# Patient Record
Sex: Male | Born: 1965 | Race: White | Hispanic: No | Marital: Married | Smoking: Former smoker
Health system: Southern US, Community
[De-identification: ages and names within clinical notes are randomized; demographics above are authoritative.]

## PROBLEM LIST (undated history)

## (undated) DIAGNOSIS — T4145XA Adverse effect of unspecified anesthetic, initial encounter: Secondary | ICD-10-CM

## (undated) DIAGNOSIS — D5 Iron deficiency anemia: Secondary | ICD-10-CM

## (undated) DIAGNOSIS — T8859XA Other complications of anesthesia, initial encounter: Secondary | ICD-10-CM

## (undated) DIAGNOSIS — M109 Gout, unspecified: Secondary | ICD-10-CM

## (undated) DIAGNOSIS — I82409 Acute embolism and thrombosis of unspecified deep veins of unspecified lower extremity: Secondary | ICD-10-CM

## (undated) DIAGNOSIS — E785 Hyperlipidemia, unspecified: Secondary | ICD-10-CM

## (undated) DIAGNOSIS — G43909 Migraine, unspecified, not intractable, without status migrainosus: Secondary | ICD-10-CM

## (undated) DIAGNOSIS — Z9989 Dependence on other enabling machines and devices: Secondary | ICD-10-CM

## (undated) DIAGNOSIS — R600 Localized edema: Secondary | ICD-10-CM

## (undated) DIAGNOSIS — N185 Chronic kidney disease, stage 5: Secondary | ICD-10-CM

## (undated) DIAGNOSIS — I2510 Atherosclerotic heart disease of native coronary artery without angina pectoris: Secondary | ICD-10-CM

## (undated) DIAGNOSIS — I219 Acute myocardial infarction, unspecified: Secondary | ICD-10-CM

## (undated) DIAGNOSIS — I4892 Unspecified atrial flutter: Secondary | ICD-10-CM

## (undated) DIAGNOSIS — E669 Obesity, unspecified: Secondary | ICD-10-CM

## (undated) DIAGNOSIS — D631 Anemia in chronic kidney disease: Secondary | ICD-10-CM

## (undated) DIAGNOSIS — R0602 Shortness of breath: Secondary | ICD-10-CM

## (undated) DIAGNOSIS — I10 Essential (primary) hypertension: Secondary | ICD-10-CM

## (undated) DIAGNOSIS — R0600 Dyspnea, unspecified: Secondary | ICD-10-CM

## (undated) DIAGNOSIS — T82898A Other specified complication of vascular prosthetic devices, implants and grafts, initial encounter: Secondary | ICD-10-CM

## (undated) DIAGNOSIS — F329 Major depressive disorder, single episode, unspecified: Secondary | ICD-10-CM

## (undated) DIAGNOSIS — R0609 Other forms of dyspnea: Secondary | ICD-10-CM

## (undated) DIAGNOSIS — F419 Anxiety disorder, unspecified: Secondary | ICD-10-CM

## (undated) DIAGNOSIS — G4733 Obstructive sleep apnea (adult) (pediatric): Secondary | ICD-10-CM

## (undated) DIAGNOSIS — I480 Paroxysmal atrial fibrillation: Secondary | ICD-10-CM

## (undated) DIAGNOSIS — R079 Chest pain, unspecified: Secondary | ICD-10-CM

## (undated) DIAGNOSIS — N184 Chronic kidney disease, stage 4 (severe): Secondary | ICD-10-CM

## (undated) DIAGNOSIS — I5042 Chronic combined systolic (congestive) and diastolic (congestive) heart failure: Secondary | ICD-10-CM

## (undated) DIAGNOSIS — I471 Supraventricular tachycardia: Secondary | ICD-10-CM

## (undated) DIAGNOSIS — E119 Type 2 diabetes mellitus without complications: Secondary | ICD-10-CM

## (undated) DIAGNOSIS — Z01818 Encounter for other preprocedural examination: Secondary | ICD-10-CM

## (undated) DIAGNOSIS — K219 Gastro-esophageal reflux disease without esophagitis: Secondary | ICD-10-CM

## (undated) DIAGNOSIS — K7469 Other cirrhosis of liver: Secondary | ICD-10-CM

## (undated) DIAGNOSIS — I5031 Acute diastolic (congestive) heart failure: Secondary | ICD-10-CM

## (undated) DIAGNOSIS — F32A Depression, unspecified: Secondary | ICD-10-CM

## (undated) DIAGNOSIS — G5603 Carpal tunnel syndrome, bilateral upper limbs: Secondary | ICD-10-CM

## (undated) DIAGNOSIS — I209 Angina pectoris, unspecified: Secondary | ICD-10-CM

## (undated) DIAGNOSIS — Z9289 Personal history of other medical treatment: Secondary | ICD-10-CM

## (undated) DIAGNOSIS — N186 End stage renal disease: Secondary | ICD-10-CM

## (undated) DIAGNOSIS — R011 Cardiac murmur, unspecified: Secondary | ICD-10-CM

## (undated) DIAGNOSIS — D519 Vitamin B12 deficiency anemia, unspecified: Secondary | ICD-10-CM

## (undated) DIAGNOSIS — K922 Gastrointestinal hemorrhage, unspecified: Secondary | ICD-10-CM

## (undated) DIAGNOSIS — I6529 Occlusion and stenosis of unspecified carotid artery: Secondary | ICD-10-CM

## (undated) DIAGNOSIS — I509 Heart failure, unspecified: Secondary | ICD-10-CM

## (undated) HISTORY — PX: ANTERIOR CERVICAL DECOMP/DISCECTOMY FUSION: SHX1161

## (undated) HISTORY — DX: Hyperlipidemia, unspecified: E78.5

## (undated) HISTORY — DX: Acute diastolic (congestive) heart failure: I50.31

## (undated) HISTORY — DX: Anxiety disorder, unspecified: F41.9

## (undated) HISTORY — DX: Essential (primary) hypertension: I10

## (undated) HISTORY — DX: Chronic combined systolic (congestive) and diastolic (congestive) heart failure: I50.42

## (undated) HISTORY — DX: Other specified complication of vascular prosthetic devices, implants and grafts, initial encounter: T82.898A

## (undated) HISTORY — DX: Supraventricular tachycardia: I47.1

## (undated) HISTORY — DX: Chest pain, unspecified: R07.9

## (undated) HISTORY — DX: Paroxysmal atrial fibrillation: I48.0

## (undated) HISTORY — DX: End stage renal disease: N18.6

## (undated) HISTORY — DX: Obesity, unspecified: E66.9

## (undated) HISTORY — DX: Major depressive disorder, single episode, unspecified: F32.9

## (undated) HISTORY — DX: Atherosclerotic heart disease of native coronary artery without angina pectoris: I25.10

## (undated) HISTORY — DX: Type 2 diabetes mellitus without complications: E11.9

## (undated) HISTORY — DX: Chronic kidney disease, stage 5: N18.5

## (undated) HISTORY — DX: Occlusion and stenosis of unspecified carotid artery: I65.29

## (undated) HISTORY — DX: Unspecified atrial flutter: I48.92

## (undated) HISTORY — PX: AV FISTULA PLACEMENT: SHX1204

## (undated) HISTORY — PX: TONSILLECTOMY: SUR1361

## (undated) HISTORY — DX: Other cirrhosis of liver: K74.69

## (undated) HISTORY — DX: Encounter for other preprocedural examination: Z01.818

## (undated) HISTORY — DX: Shortness of breath: R06.02

## (undated) HISTORY — PX: CERVICAL SPINE SURGERY: SHX589

## (undated) HISTORY — PX: FINGER FRACTURE SURGERY: SHX638

## (undated) HISTORY — DX: Anemia in chronic kidney disease: D63.1

## (undated) HISTORY — PX: CORONARY ANGIOPLASTY WITH STENT PLACEMENT: SHX49

## (undated) HISTORY — DX: Chronic kidney disease, stage 4 (severe): N18.4

## (undated) HISTORY — DX: Cardiac murmur, unspecified: R01.1

## (undated) HISTORY — DX: Localized edema: R60.0

## (undated) HISTORY — DX: Depression, unspecified: F32.A

## (undated) HISTORY — DX: Gastrointestinal hemorrhage, unspecified: K92.2

---

## 2009-07-13 DIAGNOSIS — G4733 Obstructive sleep apnea (adult) (pediatric): Secondary | ICD-10-CM

## 2009-07-13 DIAGNOSIS — I82409 Acute embolism and thrombosis of unspecified deep veins of unspecified lower extremity: Secondary | ICD-10-CM

## 2009-07-13 HISTORY — DX: Acute embolism and thrombosis of unspecified deep veins of unspecified lower extremity: I82.409

## 2009-07-13 HISTORY — DX: Obstructive sleep apnea (adult) (pediatric): G47.33

## 2009-09-02 ENCOUNTER — Inpatient Hospital Stay (HOSPITAL_COMMUNITY): Admission: EM | Admit: 2009-09-02 | Discharge: 2009-09-04 | Payer: Self-pay | Admitting: Internal Medicine

## 2009-09-02 ENCOUNTER — Ambulatory Visit: Payer: Self-pay | Admitting: Cardiology

## 2009-09-04 ENCOUNTER — Encounter (INDEPENDENT_AMBULATORY_CARE_PROVIDER_SITE_OTHER): Payer: Self-pay | Admitting: Internal Medicine

## 2010-03-04 ENCOUNTER — Ambulatory Visit: Payer: Self-pay | Admitting: Vascular Surgery

## 2010-03-05 ENCOUNTER — Ambulatory Visit (HOSPITAL_COMMUNITY): Admission: RE | Admit: 2010-03-05 | Discharge: 2010-03-05 | Payer: Self-pay | Admitting: Vascular Surgery

## 2010-03-05 ENCOUNTER — Ambulatory Visit: Payer: Self-pay | Admitting: Vascular Surgery

## 2010-03-05 HISTORY — PX: VEIN SURGERY: SHX48

## 2010-04-11 ENCOUNTER — Ambulatory Visit: Payer: Self-pay | Admitting: Vascular Surgery

## 2010-07-13 DIAGNOSIS — D5 Iron deficiency anemia: Secondary | ICD-10-CM

## 2010-07-13 HISTORY — PX: LACERATION REPAIR: SHX5168

## 2010-07-13 HISTORY — DX: Iron deficiency anemia: D50

## 2010-09-26 LAB — POCT I-STAT 4, (NA,K, GLUC, HGB,HCT)
Glucose, Bld: 122 mg/dL — ABNORMAL HIGH (ref 70–99)
Sodium: 140 mEq/L (ref 135–145)

## 2010-10-03 LAB — CBC
HCT: 38.1 % — ABNORMAL LOW (ref 39.0–52.0)
Hemoglobin: 12.7 g/dL — ABNORMAL LOW (ref 13.0–17.0)
MCHC: 32.8 g/dL (ref 30.0–36.0)
MCV: 87.6 fL (ref 78.0–100.0)
Platelets: 279 10*3/uL (ref 150–400)
RBC: 4.37 MIL/uL (ref 4.22–5.81)
RDW: 17.5 % — ABNORMAL HIGH (ref 11.5–15.5)
WBC: 8.2 10*3/uL (ref 4.0–10.5)

## 2010-10-03 LAB — GLUCOSE, CAPILLARY: Glucose-Capillary: 125 mg/dL — ABNORMAL HIGH (ref 70–99)

## 2010-10-03 LAB — COMPREHENSIVE METABOLIC PANEL
ALT: 18 U/L (ref 0–53)
Alkaline Phosphatase: 33 U/L — ABNORMAL LOW (ref 39–117)
CO2: 26 mEq/L (ref 19–32)
GFR calc Af Amer: 20 mL/min — ABNORMAL LOW (ref >60)
GFR calc non Af Amer: 16 mL/min — ABNORMAL LOW (ref >60)
Glucose, Bld: 96 mg/dL (ref 70–99)
Potassium: 3.7 mEq/L (ref 3.5–5.1)
Total Bilirubin: 0.9 mg/dL (ref 0.3–1.2)
Total Protein: 6.7 g/dL (ref 6.0–8.3)

## 2010-10-03 LAB — CK TOTAL AND CKMB (NOT AT ARMC)
CK, MB: 3.9 ng/mL (ref 0.3–4.0)
Relative Index: 2.6 — ABNORMAL HIGH (ref 0.0–2.5)
Relative Index: 2.7 — ABNORMAL HIGH (ref 0.0–2.5)
Relative Index: 3 — ABNORMAL HIGH (ref 0.0–2.5)

## 2010-10-03 LAB — BASIC METABOLIC PANEL
Calcium: 9.1 mg/dL (ref 8.4–10.5)
Creatinine, Ser: 3.89 mg/dL — ABNORMAL HIGH (ref 0.4–1.5)
GFR calc non Af Amer: 17 mL/min — ABNORMAL LOW (ref >60)
Glucose, Bld: 105 mg/dL — ABNORMAL HIGH (ref 70–99)
Potassium: 3.8 mEq/L (ref 3.5–5.1)
Sodium: 139 mEq/L (ref 135–145)

## 2010-10-03 LAB — DIFFERENTIAL
Basophils Absolute: 0.1 10*3/uL (ref 0.0–0.1)
Eosinophils Absolute: 0.4 10*3/uL (ref 0.0–0.7)
Lymphocytes Relative: 18 % (ref 12–46)

## 2010-10-03 LAB — TROPONIN I: Troponin I: 0.15 ng/mL — ABNORMAL HIGH (ref 0.00–0.06)

## 2010-11-25 NOTE — Assessment & Plan Note (Signed)
OFFICE VISIT   SCALES, Carlos Heath  DOB:  11-Oct-1965                                       04/11/2010  ZOXWR#:[id number]   This is a postoperative followup.  The patient is status post second  stage basilic vein transposition that was done on 03/05/2010.  The  patient notes since the operation some issues with some firmness at the  surgical site that resolved with time.  Hr was given some antibiotics by  his primary care physician but only took it for 5 days and the firmness  in the surgical site resolved with time.  He never had any fevers or  chills and denies any steal symptoms in the hand.  He does note some  ulnar anesthesia that he thinks may be different from his previous  anesthesia that he had in the hand.   His past medical history, past surgical history, family history, social  history, review of systems are as per previous visit on 03/04/2010.   On physical examination today he had vital signs of temperature 97.8, a  blood pressure 131/67, a heart rate of 50 and respirations of 12.  On  focused exam on the right arm he has an easily palpable thrill that is  palpable on top of his biceps and this thrill extends into the axilla.  No further hematoma is palpable at this location.  The incision site  seen appeared to be healing well.  He has 5/5 hand grip strength with  intact intrinsic and extrinsic muscle strength.  He has grossly intact  sensation to light touch and pinpoint in his fingers.   MEDICAL DECISION MAKING:  This is a 45 year old gentleman now status  post second stage basilic vein transposition.  He is now approximately  almost 1.3 months out from his procedure.  At this point there is  sufficient healing that his basilic vein transposition could now be  utilized for hemodialysis as necessary.  Per the patient, he does not  yet need hemodialysis.  This should give him some additional time to  fully heal his incision site and further  incorporate the basilic vein  transposition but he should be ready for cannulation when hemodialysis  is necessary.  He also is considering transplant.  I have discussed with  him the advantages and disadvantages of transplant and it appears he is  interested in such.  He will follow up with his nephrologist for full  evaluation of such.  I discussed with the patient that he will follow up  as needed for any additional dialysis access needs.  Thank you for  giving me the opportunity to participate in this patient's care.     Leonides Sake, MD  Electronically Signed   BC/MEDQ  D:  04/11/2010  Heath:  04/14/2010  Job:  [id number]   cc:   [name], M.D.

## 2010-11-25 NOTE — Consult Note (Signed)
NEW PATIENT Carlos Heath, Manas  DOB:  22-Feb-1966                                       03/04/2010  ZOXWR#:[id number]   This is a 45 year old patient with chronic renal insufficiency who had  an AV fistula created previously in [place], [place] in 2007.  Initially, he had a wrist fistula performed ([facility]) and eventually,  after vein mapping revealed the veins to be quite small, he had a  fistula created at his antecubital area in the right arm.  He was told  that this would later need to be revised before using it.  The only  dialysis he has ever had is following cardiac cath and intervention in  February 2011 when he had 2 dialysis sessions through a femoral  catheter.  He is now progressing his renal insufficiency and will need  hemodialysis in the near future with a creatinine in the 4s currently.   CHRONIC MEDICAL PROBLEMS:  1. Hypertension.  2. Chronic renal insufficiency.  3. Hyperlipidemia.  4. Coronary artery disease, previous PTCA and stenting on 3 occasions.  5. Sleep apnea.  6. Negative for diabetes, COPD, or stroke.   SOCIAL HISTORY:  The patient is single, has 2 children, is disabled.  He  has smoked up to a pack of cigarettes per day for 30 years; currently  smokes about 5 cigarettes a day.  Does not use alcohol.   FAMILY HISTORY:  Strongly positive for coronary artery disease in his  mother and father; stroke in his mother; hypertension in a brother.  Negative for diabetes.   REVIEW OF SYSTEMS:  Positive for anorexia, occasional blackouts in the  past.  Does have some occasional chest discomfort, dyspnea on exertion,  and has had gout in his feet in the past.  All other systems in the  review of systems are negative.   PHYSICAL EXAM:  Blood pressure 135/71, heart rate 61, respirations 20.  General:  He is a middle-aged male patient who is no parent distress,  alert and oriented x3.  HEENT:  Normal for age.  EOMs intact.   Lungs:  Clear.  No rhonchi or wheezing.  Cardiovascular:  Regular rhythm.  No  murmurs.  Carotid pulses 3+.  No audible bruit.  Abdomen:  Soft,  nontender with no masses palpable.  Musculoskeletal:  Free of major  deformities.  Neurologic:  Normal.  Skin:  Free of rashes.  Lower  extremity exam reveals 3+ femoral, popliteal, and dorsalis pedis pulses  bilaterally.  Upper extremity exam reveals the right arm to have an AV  fistula with a transverse incision in the antecubital area.  This  appears to be anastomosed to the basilic vein but has not been rerouted.  Cephalic vein is absent in the forearm and diminutive in the upper arm.  Both hands are well-perfused.   I examined the patient with the SonoSite today to confirm that this is,  indeed, a brachiobasilic fistula and the vein has enlarged significantly  up to the axillary area.   I discussed with the patient that this will need to be either re-routed  or superficialized to some degree before it can be utilized.  We will  schedule this to be done tomorrow by Dr. [name] at [facility] under  general anesthesia.  Hopefully, this will provide a satisfactory site  for vascular  access in this nice man who will need dialysis.     Carlos Heath, M.D.  Electronically Signed   JDL/MEDQ  D:  03/04/2010  T:  03/05/2010  Job:  [id number]   cc:   [name], M.D.

## 2011-01-29 ENCOUNTER — Ambulatory Visit (INDEPENDENT_AMBULATORY_CARE_PROVIDER_SITE_OTHER): Payer: Medicare Other

## 2011-01-29 DIAGNOSIS — N186 End stage renal disease: Secondary | ICD-10-CM

## 2011-01-29 NOTE — Assessment & Plan Note (Addendum)
OFFICE VISIT  DEVEON, KISIEL T DOB:  Apr 25, 1966                                       01/29/2011 ZOXWR#:[id number]  The patient is a 45 year old male who presents today for evaluation of right basilic vein transposition per Dr. Marland Mcalpine request.  The patient had second stage right basilic vein transposition performed by Dr. [name] on March 05, 2010.  The patient was seen in follow-up, and his fistula was maturing nicely.  He was instructed to follow up p.r.n.  The patient has not been started on hemodialysis.  The patient states that at his evaluation with Dr. Hyman Hopes a couple days ago, Dr. Hyman Hopes expressed concern that the fistula was not developing as he had expected.  Therefore, the patient was instructed to follow up here for evaluation of his fistula.  Past medical history, surgical history, medications, allergies, social history, family history, review of systems are unchanged from previous office visit.  PHYSICAL EXAMINATION:  Blood pressure 150/59 left arm sitting, O2 sat 97%, heart rate 64.  In general, this is a well-nourished male in no acute distress.  HEENT:  PERRL, EOMI.  Lungs:  Clear to auscultation. Cardiovascular:  Regular rate and rhythm.  Abdomen is soft, nontender with active bowel sounds.  Musculoskeletal:  No major deformities or cyanosis.  Extremities:  There is a 1+ right radial and 1+ right ulnar pulse palpated.  There is an excellent thrill in a well-developed right basilic vein transposition.  The fistula is mature, and this thrill was palpable up into the axilla.  There is 1 competing branch noted, but it does not appear to affect the strength of the fistula.  The previous incision is well-healed.  Motor and sensation are intact in the right upper extremity.  Neuro:  No focal weakness or paresthesias.  Skin:  No ulcers, no rashes.  ASSESSMENT:  Chronic renal insufficiency with a right basilic vein transposition, not on  hemodialysis.  PLAN:  The patient has a well-developed, mature, right basilic vein transposition.  For completeness, we will have the patient follow up within the next 3 months for an ultrasound of the fistula to measure depth and diameter.  This patient was discussed and also evaluated by Dr. Darrick Penna, who agrees with the assessment and plan.  Pecola Leisure, PA  Charles E. Fields, MD Electronically Signed  AY/MEDQ  D:  01/29/2011  T:  01/29/2011  Job:  [id number]

## 2011-03-18 ENCOUNTER — Encounter: Payer: Self-pay | Admitting: Vascular Surgery

## 2011-04-30 ENCOUNTER — Encounter: Payer: Self-pay | Admitting: Vascular Surgery

## 2011-05-01 ENCOUNTER — Ambulatory Visit: Payer: Medicare Other | Admitting: Vascular Surgery

## 2011-05-15 ENCOUNTER — Encounter (HOSPITAL_COMMUNITY): Payer: Medicare Other

## 2011-07-16 DIAGNOSIS — N039 Chronic nephritic syndrome with unspecified morphologic changes: Secondary | ICD-10-CM | POA: Diagnosis not present

## 2011-07-16 DIAGNOSIS — D631 Anemia in chronic kidney disease: Secondary | ICD-10-CM | POA: Diagnosis not present

## 2011-07-16 DIAGNOSIS — Z79899 Other long term (current) drug therapy: Secondary | ICD-10-CM | POA: Diagnosis not present

## 2011-07-16 DIAGNOSIS — N184 Chronic kidney disease, stage 4 (severe): Secondary | ICD-10-CM | POA: Diagnosis not present

## 2011-07-23 DIAGNOSIS — Z79899 Other long term (current) drug therapy: Secondary | ICD-10-CM | POA: Diagnosis not present

## 2011-07-23 DIAGNOSIS — D631 Anemia in chronic kidney disease: Secondary | ICD-10-CM | POA: Diagnosis not present

## 2011-07-23 DIAGNOSIS — N184 Chronic kidney disease, stage 4 (severe): Secondary | ICD-10-CM | POA: Diagnosis not present

## 2011-07-30 DIAGNOSIS — N184 Chronic kidney disease, stage 4 (severe): Secondary | ICD-10-CM | POA: Diagnosis not present

## 2011-07-30 DIAGNOSIS — D631 Anemia in chronic kidney disease: Secondary | ICD-10-CM | POA: Diagnosis not present

## 2011-07-30 DIAGNOSIS — Z79899 Other long term (current) drug therapy: Secondary | ICD-10-CM | POA: Diagnosis not present

## 2011-07-30 DIAGNOSIS — N039 Chronic nephritic syndrome with unspecified morphologic changes: Secondary | ICD-10-CM | POA: Diagnosis not present

## 2011-08-06 DIAGNOSIS — D631 Anemia in chronic kidney disease: Secondary | ICD-10-CM | POA: Diagnosis not present

## 2011-08-06 DIAGNOSIS — D509 Iron deficiency anemia, unspecified: Secondary | ICD-10-CM | POA: Diagnosis not present

## 2011-08-06 DIAGNOSIS — N039 Chronic nephritic syndrome with unspecified morphologic changes: Secondary | ICD-10-CM | POA: Diagnosis not present

## 2011-08-06 DIAGNOSIS — N184 Chronic kidney disease, stage 4 (severe): Secondary | ICD-10-CM | POA: Diagnosis not present

## 2011-08-06 DIAGNOSIS — Z79899 Other long term (current) drug therapy: Secondary | ICD-10-CM | POA: Diagnosis not present

## 2011-08-10 DIAGNOSIS — I10 Essential (primary) hypertension: Secondary | ICD-10-CM | POA: Diagnosis not present

## 2011-08-10 DIAGNOSIS — Z79899 Other long term (current) drug therapy: Secondary | ICD-10-CM | POA: Diagnosis not present

## 2011-08-10 DIAGNOSIS — N189 Chronic kidney disease, unspecified: Secondary | ICD-10-CM | POA: Diagnosis not present

## 2011-08-10 DIAGNOSIS — R0989 Other specified symptoms and signs involving the circulatory and respiratory systems: Secondary | ICD-10-CM | POA: Diagnosis not present

## 2011-08-10 DIAGNOSIS — I2510 Atherosclerotic heart disease of native coronary artery without angina pectoris: Secondary | ICD-10-CM | POA: Diagnosis not present

## 2011-08-10 DIAGNOSIS — E785 Hyperlipidemia, unspecified: Secondary | ICD-10-CM | POA: Diagnosis not present

## 2011-08-13 DIAGNOSIS — N184 Chronic kidney disease, stage 4 (severe): Secondary | ICD-10-CM | POA: Diagnosis not present

## 2011-08-13 DIAGNOSIS — Z79899 Other long term (current) drug therapy: Secondary | ICD-10-CM | POA: Diagnosis not present

## 2011-08-13 DIAGNOSIS — D631 Anemia in chronic kidney disease: Secondary | ICD-10-CM | POA: Diagnosis not present

## 2011-08-21 DIAGNOSIS — T148XXA Other injury of unspecified body region, initial encounter: Secondary | ICD-10-CM | POA: Diagnosis not present

## 2011-08-21 DIAGNOSIS — S139XXA Sprain of joints and ligaments of unspecified parts of neck, initial encounter: Secondary | ICD-10-CM | POA: Diagnosis not present

## 2011-08-21 DIAGNOSIS — S298XXA Other specified injuries of thorax, initial encounter: Secondary | ICD-10-CM | POA: Diagnosis not present

## 2011-08-21 DIAGNOSIS — S335XXA Sprain of ligaments of lumbar spine, initial encounter: Secondary | ICD-10-CM | POA: Diagnosis not present

## 2011-08-27 DIAGNOSIS — N184 Chronic kidney disease, stage 4 (severe): Secondary | ICD-10-CM | POA: Diagnosis not present

## 2011-08-27 DIAGNOSIS — N039 Chronic nephritic syndrome with unspecified morphologic changes: Secondary | ICD-10-CM | POA: Diagnosis not present

## 2011-08-27 DIAGNOSIS — D631 Anemia in chronic kidney disease: Secondary | ICD-10-CM | POA: Diagnosis not present

## 2011-08-31 DIAGNOSIS — I129 Hypertensive chronic kidney disease with stage 1 through stage 4 chronic kidney disease, or unspecified chronic kidney disease: Secondary | ICD-10-CM | POA: Diagnosis not present

## 2011-08-31 DIAGNOSIS — Z79899 Other long term (current) drug therapy: Secondary | ICD-10-CM | POA: Diagnosis not present

## 2011-08-31 DIAGNOSIS — R5381 Other malaise: Secondary | ICD-10-CM | POA: Diagnosis not present

## 2011-08-31 DIAGNOSIS — R0789 Other chest pain: Secondary | ICD-10-CM | POA: Diagnosis not present

## 2011-08-31 DIAGNOSIS — R0602 Shortness of breath: Secondary | ICD-10-CM | POA: Diagnosis not present

## 2011-08-31 DIAGNOSIS — R5383 Other fatigue: Secondary | ICD-10-CM | POA: Diagnosis not present

## 2011-08-31 DIAGNOSIS — R51 Headache: Secondary | ICD-10-CM | POA: Diagnosis not present

## 2011-08-31 DIAGNOSIS — Z87891 Personal history of nicotine dependence: Secondary | ICD-10-CM | POA: Diagnosis not present

## 2011-08-31 DIAGNOSIS — E7800 Pure hypercholesterolemia, unspecified: Secondary | ICD-10-CM | POA: Diagnosis not present

## 2011-08-31 DIAGNOSIS — I252 Old myocardial infarction: Secondary | ICD-10-CM | POA: Diagnosis not present

## 2011-08-31 DIAGNOSIS — N189 Chronic kidney disease, unspecified: Secondary | ICD-10-CM | POA: Diagnosis not present

## 2011-08-31 DIAGNOSIS — R110 Nausea: Secondary | ICD-10-CM | POA: Diagnosis not present

## 2011-09-01 DIAGNOSIS — N184 Chronic kidney disease, stage 4 (severe): Secondary | ICD-10-CM | POA: Diagnosis not present

## 2011-09-01 DIAGNOSIS — N183 Chronic kidney disease, stage 3 unspecified: Secondary | ICD-10-CM | POA: Diagnosis not present

## 2011-09-01 DIAGNOSIS — N2581 Secondary hyperparathyroidism of renal origin: Secondary | ICD-10-CM | POA: Diagnosis not present

## 2011-09-01 DIAGNOSIS — D649 Anemia, unspecified: Secondary | ICD-10-CM | POA: Diagnosis not present

## 2011-09-01 DIAGNOSIS — I129 Hypertensive chronic kidney disease with stage 1 through stage 4 chronic kidney disease, or unspecified chronic kidney disease: Secondary | ICD-10-CM | POA: Diagnosis not present

## 2011-09-03 DIAGNOSIS — N184 Chronic kidney disease, stage 4 (severe): Secondary | ICD-10-CM | POA: Diagnosis not present

## 2011-09-03 DIAGNOSIS — D631 Anemia in chronic kidney disease: Secondary | ICD-10-CM | POA: Diagnosis not present

## 2011-09-07 DIAGNOSIS — G4733 Obstructive sleep apnea (adult) (pediatric): Secondary | ICD-10-CM | POA: Diagnosis not present

## 2011-09-07 DIAGNOSIS — I252 Old myocardial infarction: Secondary | ICD-10-CM | POA: Diagnosis not present

## 2011-09-07 DIAGNOSIS — N189 Chronic kidney disease, unspecified: Secondary | ICD-10-CM | POA: Diagnosis not present

## 2011-09-07 DIAGNOSIS — I2789 Other specified pulmonary heart diseases: Secondary | ICD-10-CM | POA: Diagnosis not present

## 2011-09-10 DIAGNOSIS — N039 Chronic nephritic syndrome with unspecified morphologic changes: Secondary | ICD-10-CM | POA: Diagnosis not present

## 2011-09-10 DIAGNOSIS — D631 Anemia in chronic kidney disease: Secondary | ICD-10-CM | POA: Diagnosis not present

## 2011-09-10 DIAGNOSIS — N184 Chronic kidney disease, stage 4 (severe): Secondary | ICD-10-CM | POA: Diagnosis not present

## 2011-09-11 ENCOUNTER — Ambulatory Visit (INDEPENDENT_AMBULATORY_CARE_PROVIDER_SITE_OTHER): Payer: Medicare Other | Admitting: Vascular Surgery

## 2011-09-11 ENCOUNTER — Encounter: Payer: Self-pay | Admitting: Vascular Surgery

## 2011-09-11 VITALS — BP 126/54 | HR 56 | Resp 16 | Ht 71.0 in | Wt 242.0 lb

## 2011-09-11 DIAGNOSIS — N184 Chronic kidney disease, stage 4 (severe): Secondary | ICD-10-CM | POA: Insufficient documentation

## 2011-09-11 DIAGNOSIS — N186 End stage renal disease: Secondary | ICD-10-CM

## 2011-09-11 HISTORY — DX: Chronic kidney disease, stage 4 (severe): N18.4

## 2011-09-11 HISTORY — DX: End stage renal disease: N18.6

## 2011-09-11 NOTE — Progress Notes (Signed)
[facility] [place]  Established Dialysis Access  History of Present Illness  Carlos Heath is a 46 y.o. ([birth year]) male who presents for re-evaluation for permanent access.  Previous access procedures have been completed in the right arm: 03/05/10 R 2nd stage BVT.  The patient's complication from previous access procedures include: none.  The patient has never had a previous PPM placed.  Pt is sent for re-evaluation of the R arm access  Past Medical History, Past Surgical History, Social History, Family History, Medications, Allergies, and Review of Systems are unchanged from previous visit on 01/29/11  Physical Examination  Filed Vitals:   09/11/11 1447  BP: 126/54  Pulse: 56  Resp: 16  Height: 5\' 11"  (1.803 m)  Weight: 242 lb (109.77 kg)  SpO2: 92%   Body mass index is 33.75 kg/(m^2).  General: A&O x 3, WDWN  Pulmonary: Sym exp, good air movt, CTAB, no rales, rhonchi, & wheezing  Cardiac: RRR, Nl S1, S2, no Murmurs, rubs or gallops  Gastrointestinal: soft, NTND, -G/R, - HSM, - masses, - CVAT B  Musculoskeletal: M/S 5/5 throughout , Extremities without  ischemic changes , palpable thrill near arterial anastomosis which attenuates proximally , strong bruit, On Sonosite: BVT > 1 cm in diameter  Neurologic: CN 2-12 intact , Pain and light touch intact in extremities , Motor exam as listed above  Medical Decision Making  [name] is a 46 y.o. male who presents with CKD Stage IV-V  Based on exam and ultrasound evaluation, this R BVT is mature  My suspicion is that the lack of thrill proximally is due to the very lower venous resistance present due to the large vein diameter (>5 years of maturation of the 1st stage)  I will obtain formal access duplex on the R arm to document this in the next month  He will follow up in 1 month  Leonides Sake, MD [facility] [place] Office: [phone] Pager: [phone]  09/11/2011, 4:51  PM

## 2011-09-16 ENCOUNTER — Encounter (HOSPITAL_COMMUNITY): Payer: Medicare Other

## 2011-09-17 ENCOUNTER — Encounter (HOSPITAL_COMMUNITY): Payer: Medicare Other

## 2011-09-17 DIAGNOSIS — D509 Iron deficiency anemia, unspecified: Secondary | ICD-10-CM | POA: Diagnosis not present

## 2011-09-17 DIAGNOSIS — D631 Anemia in chronic kidney disease: Secondary | ICD-10-CM | POA: Diagnosis not present

## 2011-09-17 DIAGNOSIS — M25519 Pain in unspecified shoulder: Secondary | ICD-10-CM | POA: Diagnosis not present

## 2011-09-17 DIAGNOSIS — R0602 Shortness of breath: Secondary | ICD-10-CM | POA: Diagnosis not present

## 2011-09-17 DIAGNOSIS — N184 Chronic kidney disease, stage 4 (severe): Secondary | ICD-10-CM | POA: Diagnosis not present

## 2011-09-17 DIAGNOSIS — N039 Chronic nephritic syndrome with unspecified morphologic changes: Secondary | ICD-10-CM | POA: Diagnosis not present

## 2011-09-20 DIAGNOSIS — D631 Anemia in chronic kidney disease: Secondary | ICD-10-CM | POA: Diagnosis not present

## 2011-09-20 DIAGNOSIS — M25519 Pain in unspecified shoulder: Secondary | ICD-10-CM | POA: Diagnosis not present

## 2011-09-20 DIAGNOSIS — R0602 Shortness of breath: Secondary | ICD-10-CM | POA: Diagnosis not present

## 2011-09-20 DIAGNOSIS — N184 Chronic kidney disease, stage 4 (severe): Secondary | ICD-10-CM | POA: Diagnosis not present

## 2011-09-20 DIAGNOSIS — N039 Chronic nephritic syndrome with unspecified morphologic changes: Secondary | ICD-10-CM | POA: Diagnosis not present

## 2011-09-20 DIAGNOSIS — D509 Iron deficiency anemia, unspecified: Secondary | ICD-10-CM | POA: Diagnosis not present

## 2011-10-12 DIAGNOSIS — D631 Anemia in chronic kidney disease: Secondary | ICD-10-CM | POA: Diagnosis not present

## 2011-10-12 DIAGNOSIS — N184 Chronic kidney disease, stage 4 (severe): Secondary | ICD-10-CM | POA: Diagnosis not present

## 2011-10-16 DIAGNOSIS — E876 Hypokalemia: Secondary | ICD-10-CM | POA: Diagnosis not present

## 2011-10-19 DIAGNOSIS — N184 Chronic kidney disease, stage 4 (severe): Secondary | ICD-10-CM | POA: Diagnosis not present

## 2011-10-19 DIAGNOSIS — D649 Anemia, unspecified: Secondary | ICD-10-CM | POA: Diagnosis not present

## 2011-10-19 DIAGNOSIS — E876 Hypokalemia: Secondary | ICD-10-CM | POA: Diagnosis not present

## 2011-10-22 ENCOUNTER — Encounter: Payer: Self-pay | Admitting: Vascular Surgery

## 2011-10-22 DIAGNOSIS — M722 Plantar fascial fibromatosis: Secondary | ICD-10-CM | POA: Diagnosis not present

## 2011-10-22 DIAGNOSIS — M109 Gout, unspecified: Secondary | ICD-10-CM | POA: Diagnosis not present

## 2011-10-23 ENCOUNTER — Ambulatory Visit: Payer: Medicare Other | Admitting: Vascular Surgery

## 2011-11-05 DIAGNOSIS — I10 Essential (primary) hypertension: Secondary | ICD-10-CM | POA: Diagnosis not present

## 2011-11-05 DIAGNOSIS — E782 Mixed hyperlipidemia: Secondary | ICD-10-CM | POA: Diagnosis not present

## 2011-11-05 DIAGNOSIS — F331 Major depressive disorder, recurrent, moderate: Secondary | ICD-10-CM | POA: Diagnosis not present

## 2011-11-05 DIAGNOSIS — M109 Gout, unspecified: Secondary | ICD-10-CM | POA: Diagnosis not present

## 2011-11-05 DIAGNOSIS — N184 Chronic kidney disease, stage 4 (severe): Secondary | ICD-10-CM | POA: Diagnosis not present

## 2011-11-12 ENCOUNTER — Encounter: Payer: Self-pay | Admitting: Vascular Surgery

## 2011-11-13 ENCOUNTER — Ambulatory Visit (INDEPENDENT_AMBULATORY_CARE_PROVIDER_SITE_OTHER): Payer: Medicare Other | Admitting: Vascular Surgery

## 2011-11-13 ENCOUNTER — Encounter: Payer: Self-pay | Admitting: Vascular Surgery

## 2011-11-13 ENCOUNTER — Encounter (INDEPENDENT_AMBULATORY_CARE_PROVIDER_SITE_OTHER): Payer: Medicare Other | Admitting: *Deleted

## 2011-11-13 VITALS — BP 118/67 | HR 56 | Temp 98.3°F | Ht 68.0 in | Wt 226.0 lb

## 2011-11-13 DIAGNOSIS — N184 Chronic kidney disease, stage 4 (severe): Secondary | ICD-10-CM

## 2011-11-13 DIAGNOSIS — N186 End stage renal disease: Secondary | ICD-10-CM

## 2011-11-13 NOTE — Progress Notes (Addendum)
[facility] [place]  Established Dialysis Access  History of Present Illness  Carlos Heath is a 46 y.o. (07-13-1966) male who presents for follow up R arm access duplex.  He still is not on hemodialysis.  He denies any steal sx.  He does not some mild uremia sx but has not been told he need dialysis yet.  Past Medical History, Past Surgical History, Social History, Family History, Medications, Allergies, and Review of Systems are unchanged from previous visit on 09/11/11.  Physical Examination  Filed Vitals:   11/13/11 0956  BP: 118/67  Pulse: 56  Temp: 98.3 F (36.8 C)  TempSrc: Oral  Height: 5\' 8"  (1.727 m)  Weight: 226 lb (102.513 kg)   Body mass index is 34.36 kg/(m^2).  General: A&O x 3, WDWN, mildly obese  Pulmonary: Sym exp, good air movt, CTAB, no rales, rhonchi, & wheezing  Cardiac: RRR, Nl S1, S2, no Murmurs, rubs or gallops  Gastrointestinal: soft, NTND, -G/R, - HSM, - masses, - CVAT B  Musculoskeletal: M/S 5/5 throughout , Extremities without  ischemic changes , palpable thrill in distal arm up to mid arm  Neurologic: CN 2-12 intact , Pain and light touch intact in extremities , Motor exam as listed above  Non-Invasive Vascular Imaging  R arm access duplex (Date: 11/13/11):   BVT .97-1.6 cm in diameter  Depth: 0.33 - 1.07 cm  Medical Decision Making  Carlos Heath is a 46 y.o. male who presents with CKD IV   Based on his access duplex, he probably can be cannulated proximally and distally.  The mid segment may be too deep and need additional superificalization, which is unusual as I usually superficalize the basilic veinas part of the 2nd stage of the BVT.  I had an extensive discussion with this patient in regards to the nature of access surgery, including risk, benefits, and alternatives.    The patient is aware that the risks of access surgery include but are not limited to: bleeding, infection, steal syndrome, nerve damage,  ischemic monomelic neuropathy, failure of access to mature, and possible need for additional access procedures in the future.  The patient want to discuss it with Dr. Hyman Hopes prior to proceeding.   Leonides Sake, MD [facility] [place] Office: [phone] Pager: [phone]  11/13/2011, 1:31 PM

## 2011-11-17 NOTE — Procedures (Unsigned)
VASCULAR LAB EXAM  INDICATION:  Chronic kidney disease stage 4.  HISTORY: Diabetes: Cardiac: Hypertension:  EXAM:  Right brachiobasilic AV fistula duplex.  IMPRESSION: 1. Patent right brachiobasilic arteriovenous fistula with no internal     vessel narrowing noted.  Maximum velocity of 341 cm/s noted at the     distal brachial artery level with no internal narrowing noted. 2. The right radial artery flow appears antegrade with a velocity of     25 cm/s noted at rest and 67 cm/s noted with arteriovenous fistula     compression. 3. Depth, diameter, and velocity measurements are noted on the     attached worksheet.  ___________________________________________ Fransisco Hertz, MD  CH/MEDQ  D:  11/16/2011  T:  11/16/2011  Job:  [id number]

## 2011-11-18 DIAGNOSIS — D649 Anemia, unspecified: Secondary | ICD-10-CM | POA: Diagnosis not present

## 2011-11-18 DIAGNOSIS — N184 Chronic kidney disease, stage 4 (severe): Secondary | ICD-10-CM | POA: Diagnosis not present

## 2011-11-23 DIAGNOSIS — N184 Chronic kidney disease, stage 4 (severe): Secondary | ICD-10-CM | POA: Diagnosis not present

## 2011-11-23 DIAGNOSIS — D638 Anemia in other chronic diseases classified elsewhere: Secondary | ICD-10-CM | POA: Diagnosis not present

## 2011-12-02 ENCOUNTER — Other Ambulatory Visit: Payer: Self-pay | Admitting: *Deleted

## 2011-12-02 ENCOUNTER — Encounter (HOSPITAL_COMMUNITY): Payer: Self-pay | Admitting: Pharmacy Technician

## 2011-12-02 DIAGNOSIS — K219 Gastro-esophageal reflux disease without esophagitis: Secondary | ICD-10-CM | POA: Diagnosis not present

## 2011-12-02 DIAGNOSIS — M109 Gout, unspecified: Secondary | ICD-10-CM | POA: Diagnosis not present

## 2011-12-02 DIAGNOSIS — F331 Major depressive disorder, recurrent, moderate: Secondary | ICD-10-CM | POA: Diagnosis not present

## 2011-12-15 ENCOUNTER — Encounter (HOSPITAL_COMMUNITY): Payer: Self-pay | Admitting: *Deleted

## 2011-12-15 DIAGNOSIS — N184 Chronic kidney disease, stage 4 (severe): Secondary | ICD-10-CM | POA: Diagnosis not present

## 2011-12-15 DIAGNOSIS — D638 Anemia in other chronic diseases classified elsewhere: Secondary | ICD-10-CM | POA: Diagnosis not present

## 2011-12-15 MED ORDER — SODIUM CHLORIDE 0.9 % IV SOLN
INTRAVENOUS | Status: DC
  Administered 2011-12-16: 13:00:00 via INTRAVENOUS

## 2011-12-15 NOTE — Progress Notes (Addendum)
After phone interview, we had several places to contact for info.........(Delice Bison is aware). EKG & Cxr were performed at [facility] Sleep Study was performed at [facility] in [facility] Cardiac Cath was performed at [facility] Last office note from [facility] in [place].........  Plus, the patient did tell me that after his last surgery, he was unable to void and had to have a catheter Placed...Marland KitchenMarland KitchenMarland Kitchen  Plus, he has h/o MRSA.. Aug 2011     .DA

## 2011-12-16 ENCOUNTER — Encounter (HOSPITAL_COMMUNITY)
Admission: RE | Disposition: A | Discharge status: Home / Self Care | Payer: Self-pay | Source: Ambulatory Visit | Attending: Vascular Surgery

## 2011-12-16 ENCOUNTER — Ambulatory Visit (HOSPITAL_COMMUNITY)
Admission: RE | Admit: 2011-12-16 | Discharge: 2011-12-16 | Disposition: A | Discharge status: Home / Self Care | Payer: Medicare Other | Source: Ambulatory Visit | Attending: Vascular Surgery | Admitting: Vascular Surgery

## 2011-12-16 ENCOUNTER — Encounter (HOSPITAL_COMMUNITY): Payer: Self-pay | Admitting: Anesthesiology

## 2011-12-16 ENCOUNTER — Encounter (HOSPITAL_COMMUNITY): Payer: Self-pay | Admitting: *Deleted

## 2011-12-16 ENCOUNTER — Ambulatory Visit (HOSPITAL_COMMUNITY): Payer: Medicare Other | Admitting: Anesthesiology

## 2011-12-16 DIAGNOSIS — I6529 Occlusion and stenosis of unspecified carotid artery: Secondary | ICD-10-CM | POA: Insufficient documentation

## 2011-12-16 DIAGNOSIS — F329 Major depressive disorder, single episode, unspecified: Secondary | ICD-10-CM | POA: Diagnosis not present

## 2011-12-16 DIAGNOSIS — Y832 Surgical operation with anastomosis, bypass or graft as the cause of abnormal reaction of the patient, or of later complication, without mention of misadventure at the time of the procedure: Secondary | ICD-10-CM | POA: Insufficient documentation

## 2011-12-16 DIAGNOSIS — G4730 Sleep apnea, unspecified: Secondary | ICD-10-CM | POA: Insufficient documentation

## 2011-12-16 DIAGNOSIS — E785 Hyperlipidemia, unspecified: Secondary | ICD-10-CM | POA: Diagnosis not present

## 2011-12-16 DIAGNOSIS — F3289 Other specified depressive episodes: Secondary | ICD-10-CM | POA: Diagnosis not present

## 2011-12-16 DIAGNOSIS — I2510 Atherosclerotic heart disease of native coronary artery without angina pectoris: Secondary | ICD-10-CM | POA: Diagnosis not present

## 2011-12-16 DIAGNOSIS — T82898A Other specified complication of vascular prosthetic devices, implants and grafts, initial encounter: Secondary | ICD-10-CM

## 2011-12-16 DIAGNOSIS — M129 Arthropathy, unspecified: Secondary | ICD-10-CM | POA: Diagnosis not present

## 2011-12-16 DIAGNOSIS — N186 End stage renal disease: Secondary | ICD-10-CM

## 2011-12-16 DIAGNOSIS — N189 Chronic kidney disease, unspecified: Secondary | ICD-10-CM | POA: Insufficient documentation

## 2011-12-16 DIAGNOSIS — I129 Hypertensive chronic kidney disease with stage 1 through stage 4 chronic kidney disease, or unspecified chronic kidney disease: Secondary | ICD-10-CM | POA: Diagnosis not present

## 2011-12-16 DIAGNOSIS — F411 Generalized anxiety disorder: Secondary | ICD-10-CM | POA: Diagnosis not present

## 2011-12-16 HISTORY — DX: Other complications of anesthesia, initial encounter: T88.59XA

## 2011-12-16 HISTORY — DX: Adverse effect of unspecified anesthetic, initial encounter: T41.45XA

## 2011-12-16 LAB — POCT I-STAT 4, (NA,K, GLUC, HGB,HCT)
HCT: 37 % — ABNORMAL LOW (ref 39.0–52.0)
Hemoglobin: 12.6 g/dL — ABNORMAL LOW (ref 13.0–17.0)
Potassium: 3.4 mEq/L — ABNORMAL LOW (ref 3.5–5.1)
Sodium: 140 mEq/L (ref 135–145)

## 2011-12-16 SURGERY — REVISON OF ARTERIOVENOUS FISTULA
Anesthesia: General | Site: Arm Upper | Laterality: Right | Wound class: Clean

## 2011-12-16 MED ORDER — LIDOCAINE HCL (CARDIAC) 20 MG/ML IV SOLN
INTRAVENOUS | Status: DC | PRN
  Administered 2011-12-16: 50 mg via INTRAVENOUS

## 2011-12-16 MED ORDER — CEFAZOLIN SODIUM 1-5 GM-% IV SOLN
INTRAVENOUS | Status: DC | PRN
  Administered 2011-12-16: 1 g via INTRAVENOUS

## 2011-12-16 MED ORDER — SODIUM CHLORIDE 0.9 % IR SOLN
Status: DC | PRN
  Administered 2011-12-16: 14:00:00

## 2011-12-16 MED ORDER — MUPIROCIN 2 % EX OINT: TOPICAL_OINTMENT | Freq: Two times a day (BID) | CUTANEOUS | Status: AC

## 2011-12-16 MED ORDER — PROPOFOL 10 MG/ML IV EMUL
INTRAVENOUS | Status: DC | PRN
  Administered 2011-12-16 (×2): 100 mg via INTRAVENOUS

## 2011-12-16 MED ORDER — SODIUM CHLORIDE 0.9 % IR SOLN
Status: DC | PRN
  Administered 2011-12-16: 1000 mL

## 2011-12-16 MED ORDER — MUPIROCIN 2 % EX OINT
TOPICAL_OINTMENT | CUTANEOUS | Status: AC
  Filled 2011-12-16: qty 22

## 2011-12-16 MED ORDER — CEFAZOLIN SODIUM 1-5 GM-% IV SOLN
INTRAVENOUS | Status: AC
  Filled 2011-12-16: qty 50

## 2011-12-16 MED ORDER — MIDAZOLAM HCL 5 MG/5ML IJ SOLN
INTRAMUSCULAR | Status: DC | PRN
  Administered 2011-12-16: 2 mg via INTRAVENOUS

## 2011-12-16 MED ORDER — MUPIROCIN 2 % EX OINT
TOPICAL_OINTMENT | Freq: Two times a day (BID) | CUTANEOUS | Status: DC
  Administered 2011-12-16: 11:00:00 via NASAL
  Filled 2011-12-16: qty 22

## 2011-12-16 MED ORDER — FENTANYL CITRATE 0.05 MG/ML IJ SOLN
INTRAMUSCULAR | Status: DC | PRN
  Administered 2011-12-16: 100 ug via INTRAVENOUS

## 2011-12-16 SURGICAL SUPPLY — 31 items
CANISTER SUCTION 2500CC (MISCELLANEOUS) ×2 IMPLANT
CLIP TI MEDIUM 6 (CLIP) ×2 IMPLANT
CLIP TI WIDE RED SMALL 6 (CLIP) ×2 IMPLANT
CLOTH BEACON ORANGE TIMEOUT ST (SAFETY) ×2 IMPLANT
COVER PROBE W GEL 5X96 (DRAPES) IMPLANT
COVER SURGICAL LIGHT HANDLE (MISCELLANEOUS) ×4 IMPLANT
DECANTER SPIKE VIAL GLASS SM (MISCELLANEOUS) ×2 IMPLANT
DERMABOND ADVANCED (GAUZE/BANDAGES/DRESSINGS) ×1
DERMABOND ADVANCED .7 DNX12 (GAUZE/BANDAGES/DRESSINGS) ×1 IMPLANT
DRAIN PENROSE 1/2X12 LTX STRL (WOUND CARE) IMPLANT
ELECT REM PT RETURN 9FT ADLT (ELECTROSURGICAL) ×2
ELECTRODE REM PT RTRN 9FT ADLT (ELECTROSURGICAL) ×1 IMPLANT
GLOVE BIO SURGEON STRL SZ7 (GLOVE) ×2 IMPLANT
GLOVE BIOGEL PI IND STRL 7.5 (GLOVE) ×1 IMPLANT
GLOVE BIOGEL PI INDICATOR 7.5 (GLOVE) ×1
GOWN STRL NON-REIN LRG LVL3 (GOWN DISPOSABLE) ×4 IMPLANT
KIT BASIN OR (CUSTOM PROCEDURE TRAY) ×2 IMPLANT
KIT ROOM TURNOVER OR (KITS) ×2 IMPLANT
NS IRRIG 1000ML POUR BTL (IV SOLUTION) ×2 IMPLANT
PACK CV ACCESS (CUSTOM PROCEDURE TRAY) ×2 IMPLANT
PAD ARMBOARD 7.5X6 YLW CONV (MISCELLANEOUS) ×4 IMPLANT
SPONGE SURGIFOAM ABS GEL 100 (HEMOSTASIS) IMPLANT
SUT MNCRL AB 4-0 PS2 18 (SUTURE) ×2 IMPLANT
SUT PROLENE 6 0 BV (SUTURE) IMPLANT
SUT PROLENE 7 0 BV 1 (SUTURE) ×2 IMPLANT
SUT VIC AB 3-0 SH 27 (SUTURE) ×1
SUT VIC AB 3-0 SH 27X BRD (SUTURE) ×1 IMPLANT
TOWEL OR 17X24 6PK STRL BLUE (TOWEL DISPOSABLE) ×2 IMPLANT
TOWEL OR 17X26 10 PK STRL BLUE (TOWEL DISPOSABLE) ×2 IMPLANT
UNDERPAD 30X30 INCONTINENT (UNDERPADS AND DIAPERS) ×2 IMPLANT
WATER STERILE IRR 1000ML POUR (IV SOLUTION) ×2 IMPLANT

## 2011-12-16 NOTE — Anesthesia Postprocedure Evaluation (Signed)
  Anesthesia Post-op Note  Patient: Carlos Heath  Procedure(s) Performed: Procedure(s) (LRB): REVISON OF ARTERIOVENOUS FISTULA (Right)  Patient Location: PACU  Anesthesia Type: General  Level of Consciousness: awake and alert   Airway and Oxygen Therapy: Patient Spontanous Breathing and Patient connected to nasal cannula oxygen  Post-op Pain: mild  Post-op Assessment: Post-op Vital signs reviewed, Patient's Cardiovascular Status Stable, Respiratory Function Stable, Patent Airway and No signs of Nausea or vomiting  Post-op Vital Signs: Reviewed and stable  Complications: No apparent anesthesia complications

## 2011-12-16 NOTE — H&P (Signed)
[facility] [place]  Brief Access History and Physical  History of Present Illness  Carlos Heath is a 46 y.o. male who presents with chief complaint: deep R BVT.  The patient presents today for superificialization of R BVT.    Past Medical History  Diagnosis Date  . Chronic kidney disease   . CAD (coronary artery disease)   . Carotid artery occlusion   . Hypertension   . Hyperlipidemia   . Anemia   . Depression   . Arthritis   . Heart murmur   . Anxiety   . Sleep apnea     Tested at [facility].....cardiology ordered it    Past Surgical History  Procedure Date  . Av fistula placement   . Cervical spine surgery   . Stents     cardiac stents placed 2011    History   Social History  . Marital Status: Single    Spouse Name: N/A    Number of Children: N/A  . Years of Education: N/A   Occupational History  . Not on file.   Social History Main Topics  . Smoking status: Former Smoker -- 1.0 packs/day for 35 years    Types: Cigarettes    Quit date: 01/11/2011  . Smokeless tobacco: Not on file  . Alcohol Use: No  . Drug Use: No  . Sexually Active:    Other Topics Concern  . Not on file   Social History Narrative  . No narrative on file    Family History  Problem Relation Age of Onset  . Heart disease Mother     before age 71  . Stroke Mother   . Hyperlipidemia Mother   . Hypertension Mother   . Heart attack Mother   . Other Mother     bleeding problems  . Heart disease Father     before age 77  . Hyperlipidemia Father   . Hypertension Father   . Heart attack Father   . Hypertension Brother     No current facility-administered medications on file prior to encounter.   Current Outpatient Prescriptions on File Prior to Encounter  Medication Sig Dispense Refill  . aspirin EC 81 MG tablet Take 325 mg by mouth daily.       Marland Kitchen desvenlafaxine (PRISTIQ) 50 MG 24 hr tablet Take 100 mg by mouth daily.      Marland Kitchen dicyclomine (BENTYL) 10  MG capsule Take 10 mg by mouth 4 (four) times daily -  before meals and at bedtime.      . febuxostat (ULORIC) 40 MG tablet Take 80 mg by mouth daily.        . furosemide (LASIX) 80 MG tablet Take 160 mg by mouth 3 (three) times daily.       . hydrALAZINE (APRESOLINE) 100 MG tablet Take 100 mg by mouth 4 (four) times daily.        Marland Kitchen labetalol (NORMODYNE) 200 MG tablet Take 300 mg by mouth 2 (two) times daily.       . metolazone (ZAROXOLYN) 2.5 MG tablet Take 2.5 mg by mouth 3 (three) times a week. Mon, Wed, and Fri      . minoxidil (LONITEN) 10 MG tablet Take 10 mg by mouth 2 (two) times daily.        . nitroGLYCERIN (NITROSTAT) 0.4 MG SL tablet Place 0.4 mg under the tongue every 5 (five) minutes as needed.        Marland Kitchen omeprazole (PRILOSEC) 40  MG capsule Take 40 mg by mouth daily.      Marland Kitchen oxycodone (OXY-IR) 5 MG capsule Take 5 mg by mouth every 4 (four) hours as needed. For pain      . potassium chloride SA (K-DUR,KLOR-CON) 20 MEQ tablet Take 20 mEq by mouth 2 (two) times daily.      . pravastatin (PRAVACHOL) 80 MG tablet Take 80 mg by mouth daily.      . promethazine (PHENERGAN) 25 MG tablet Take 25 mg by mouth every 8 (eight) hours as needed. For nausea      . zolpidem (AMBIEN) 10 MG tablet Take 10 mg by mouth at bedtime as needed. For sleep        Allergies  Allergen Reactions  . Lisinopril Rash  . Hydrocodone Nausea And Vomiting  . Morphine And Related Nausea And Vomiting  . Norvasc (Amlodipine Besylate) Hives    Review of Systems: Kidney Disease, As listed above, otherwise negative.  Physical Examination  Filed Vitals:   12/15/11 0903 12/16/11 0858  BP:  132/77  Pulse:  62  Temp:  98.6 F (37 C)  TempSrc:  Oral  Resp:  20  Height: 5\' 8"  (1.727 m)   Weight: 223 lb (101.152 kg)   SpO2:  95%   Body mass index is 33.91 kg/(m^2).  General: A&O x 3, WDWN  Pulmonary: Sym exp, good air movt, CTAB, no rales, rhonchi, & wheezing  Cardiac: RRR, Nl S1, S2, no Murmurs, rubs or  gallops  Gastrointestinal: soft, NTND, -G/R, - HSM, - masses, - CVAT B  Musculoskeletal: M/S 5/5 throughout , Extremities without ischemic changes , palpable thrill and bruit in right upper arm  Laboratory See iStat  Medical Decision Making  Carlos Heath is a 46 y.o. male who presents with: deep R BVT.   The patient is scheduled for: superficialization of R BVT  Risk, benefits, and alternatives to access surgery were discussed.  The patient is aware the risks include but are not limited to: bleeding, infection, steal syndrome, nerve damage, ischemic monomelic neuropathy, failure to mature, and need for additional procedures.  The patient is aware of the risks and agrees to proceed.  Leonides Sake, MD [facility] [place] Office: [phone] Pager: [phone]  12/16/2011, 10:51 AM

## 2011-12-16 NOTE — Anesthesia Preprocedure Evaluation (Addendum)
Anesthesia Evaluation  Patient identified by MRN, date of birth, ID band Patient awake    Reviewed: Allergy & Precautions, H&P , NPO status , Patient's Chart, lab work & pertinent test results, reviewed documented beta blocker date and time   Airway Mallampati: II TM Distance: >3 FB Neck ROM: Full    Dental No notable dental hx. (+) Teeth Intact and Dental Advisory Given   Pulmonary sleep apnea and Continuous Positive Airway Pressure Ventilation ,  breath sounds clear to auscultation  Pulmonary exam normal       Cardiovascular hypertension, On Home Beta Blockers, Pt. on medications and Pt. on home beta blockers + angina with exertion + CAD negative cardio ROS  + Valvular Problems/Murmurs Rhythm:Regular Rate:Normal     Neuro/Psych PSYCHIATRIC DISORDERS negative neurological ROS     GI/Hepatic negative GI ROS, Neg liver ROS,   Endo/Other  negative endocrine ROS  Renal/GU CRFRenal disease  negative genitourinary   Musculoskeletal   Abdominal   Peds  Hematology negative hematology ROS (+)   Anesthesia Other Findings   Reproductive/Obstetrics negative OB ROS                          Anesthesia Physical Anesthesia Plan  ASA: III  Anesthesia Plan: General   Post-op Pain Management:    Induction: Intravenous  Airway Management Planned: LMA  Additional Equipment:   Intra-op Plan:   Post-operative Plan: Extubation in OR  Informed Consent: I have reviewed the patients History and Physical, chart, labs and discussed the procedure including the risks, benefits and alternatives for the proposed anesthesia with the patient or authorized representative who has indicated his/her understanding and acceptance.   Dental advisory given  Plan Discussed with: CRNA  Anesthesia Plan Comments:         Anesthesia Quick Evaluation

## 2011-12-16 NOTE — Op Note (Signed)
OPERATIVE NOTE   PROCEDURE: 1. Intraoperative ultrasound of right basilic vein transposition   PRE-OPERATIVE DIAGNOSIS: Deep basilic vein transposition   POST-OPERATIVE DIAGNOSIS: Inaccurate preoperative access duplex  SURGEON: Leonides Sake, MD  ANESTHESIA: general  ESTIMATED BLOOD LOSS: none  FINDING(S): 1. Distal 2/3 of basilic vein transposition: 3 mm to 6 mm deep, ~15 cm of usable basilic vein transposition  2. Proximal 1/3 of basilic vein transposition: > 1 cm deep Basilic vein transposition > 8 mm throughout  SPECIMEN(S):  none  INDICATIONS:   Carlos Heath is a 46 y.o. male who presents with healed and matured left second stage basilic vein transposition which was found on access duplex to be >8 mm deep at locations.  To prepare the fistula for eventual use, I felt a superficialization procedure would be necessary, which was unusual given superficialization of the basilic vein transposition is a normal portion of the second stage procedure.  Risk, benefits, and alternatives to access surgery were discussed.  The patient is aware the risks include but are not limited to: bleeding, infection, steal syndrome, nerve damage, ischemic monomelic neuropathy, failure to mature, need for additional procedures, death and stroke.  The patient agrees to proceed forward with the procedure.  DESCRIPTION: After obtain full informed written consent, the patient was brought back to the operating room and placed supine upon the operating table.  The patient received IV antibiotics prior to induction.  After obtaining adequate anesthesia, the patient was prepped and draped in the standard fashion for: right access procedure.  I started by interrogating the basilic vein transposition with Sonosite.  Immediately, it became evident that there was likely an error in the pre-operative access duplex.  The findings on my intraoperative right arm access duplex are as listed above.  As the distal 2/3 of this  fistula meets KODQI guidelines, I don't think its to this patient's advantage to revise this fistula.  I aborted the case at this point.  I reviewed the preoperative access duplex, and clearly there is a discrepancy between the intraoperative findings and the preoperative study.  COMPLICATIONS: none  CONDITION: stable  Leonides Sake, MD [facility] [place] Office: [phone] Pager: [phone]  12/16/2011, 1:55 PM

## 2011-12-16 NOTE — Transfer of Care (Signed)
Immediate Anesthesia Transfer of Care Note  Patient: Carlos Heath  Procedure(s) Performed: Procedure(s) (LRB): REVISON OF ARTERIOVENOUS FISTULA (Right)  Patient Location: PACU  Anesthesia Type: General  Level of Consciousness: awake, alert , oriented and patient cooperative  Airway & Oxygen Therapy: Patient Spontanous Breathing and Patient connected to nasal cannula oxygen  Post-op Assessment: Report given to PACU RN, Post -op Vital signs reviewed and stable and Patient moving all extremities X 4  Post vital signs: Reviewed and stable  Complications: No apparent anesthesia complications

## 2011-12-17 DIAGNOSIS — R0989 Other specified symptoms and signs involving the circulatory and respiratory systems: Secondary | ICD-10-CM | POA: Diagnosis not present

## 2011-12-17 DIAGNOSIS — N189 Chronic kidney disease, unspecified: Secondary | ICD-10-CM | POA: Diagnosis not present

## 2011-12-17 DIAGNOSIS — I252 Old myocardial infarction: Secondary | ICD-10-CM | POA: Diagnosis not present

## 2011-12-17 DIAGNOSIS — I2789 Other specified pulmonary heart diseases: Secondary | ICD-10-CM | POA: Diagnosis not present

## 2011-12-29 DIAGNOSIS — I6529 Occlusion and stenosis of unspecified carotid artery: Secondary | ICD-10-CM | POA: Diagnosis not present

## 2011-12-29 DIAGNOSIS — E785 Hyperlipidemia, unspecified: Secondary | ICD-10-CM | POA: Diagnosis not present

## 2011-12-29 DIAGNOSIS — I252 Old myocardial infarction: Secondary | ICD-10-CM | POA: Diagnosis not present

## 2011-12-29 DIAGNOSIS — I2510 Atherosclerotic heart disease of native coronary artery without angina pectoris: Secondary | ICD-10-CM | POA: Diagnosis not present

## 2011-12-29 DIAGNOSIS — I2789 Other specified pulmonary heart diseases: Secondary | ICD-10-CM | POA: Diagnosis not present

## 2012-01-08 DIAGNOSIS — J209 Acute bronchitis, unspecified: Secondary | ICD-10-CM | POA: Diagnosis not present

## 2012-01-11 DIAGNOSIS — N2581 Secondary hyperparathyroidism of renal origin: Secondary | ICD-10-CM | POA: Diagnosis not present

## 2012-01-11 DIAGNOSIS — N184 Chronic kidney disease, stage 4 (severe): Secondary | ICD-10-CM | POA: Diagnosis not present

## 2012-01-11 DIAGNOSIS — D649 Anemia, unspecified: Secondary | ICD-10-CM | POA: Diagnosis not present

## 2012-02-16 DIAGNOSIS — N184 Chronic kidney disease, stage 4 (severe): Secondary | ICD-10-CM | POA: Diagnosis not present

## 2012-02-16 DIAGNOSIS — N2581 Secondary hyperparathyroidism of renal origin: Secondary | ICD-10-CM | POA: Diagnosis not present

## 2012-02-16 DIAGNOSIS — D649 Anemia, unspecified: Secondary | ICD-10-CM | POA: Diagnosis not present

## 2012-03-17 DIAGNOSIS — N289 Disorder of kidney and ureter, unspecified: Secondary | ICD-10-CM | POA: Diagnosis not present

## 2012-03-28 DIAGNOSIS — E876 Hypokalemia: Secondary | ICD-10-CM | POA: Diagnosis not present

## 2012-04-10 DIAGNOSIS — I6529 Occlusion and stenosis of unspecified carotid artery: Secondary | ICD-10-CM | POA: Diagnosis present

## 2012-04-10 DIAGNOSIS — Z9861 Coronary angioplasty status: Secondary | ICD-10-CM | POA: Diagnosis not present

## 2012-04-10 DIAGNOSIS — I5033 Acute on chronic diastolic (congestive) heart failure: Secondary | ICD-10-CM | POA: Diagnosis not present

## 2012-04-10 DIAGNOSIS — R072 Precordial pain: Secondary | ICD-10-CM | POA: Diagnosis not present

## 2012-04-10 DIAGNOSIS — I495 Sick sinus syndrome: Secondary | ICD-10-CM | POA: Diagnosis not present

## 2012-04-10 DIAGNOSIS — Z79899 Other long term (current) drug therapy: Secondary | ICD-10-CM | POA: Diagnosis not present

## 2012-04-10 DIAGNOSIS — I501 Left ventricular failure: Secondary | ICD-10-CM | POA: Diagnosis not present

## 2012-04-10 DIAGNOSIS — N189 Chronic kidney disease, unspecified: Secondary | ICD-10-CM | POA: Diagnosis not present

## 2012-04-10 DIAGNOSIS — G4733 Obstructive sleep apnea (adult) (pediatric): Secondary | ICD-10-CM | POA: Diagnosis present

## 2012-04-10 DIAGNOSIS — I509 Heart failure, unspecified: Secondary | ICD-10-CM | POA: Diagnosis not present

## 2012-04-10 DIAGNOSIS — K219 Gastro-esophageal reflux disease without esophagitis: Secondary | ICD-10-CM | POA: Diagnosis not present

## 2012-04-10 DIAGNOSIS — I2510 Atherosclerotic heart disease of native coronary artery without angina pectoris: Secondary | ICD-10-CM | POA: Diagnosis not present

## 2012-04-10 DIAGNOSIS — E785 Hyperlipidemia, unspecified: Secondary | ICD-10-CM | POA: Diagnosis not present

## 2012-04-10 DIAGNOSIS — I129 Hypertensive chronic kidney disease with stage 1 through stage 4 chronic kidney disease, or unspecified chronic kidney disease: Secondary | ICD-10-CM | POA: Diagnosis not present

## 2012-04-10 DIAGNOSIS — R0789 Other chest pain: Secondary | ICD-10-CM | POA: Diagnosis not present

## 2012-04-10 DIAGNOSIS — R9431 Abnormal electrocardiogram [ECG] [EKG]: Secondary | ICD-10-CM | POA: Diagnosis not present

## 2012-04-26 DIAGNOSIS — I10 Essential (primary) hypertension: Secondary | ICD-10-CM | POA: Diagnosis not present

## 2012-04-26 DIAGNOSIS — G4733 Obstructive sleep apnea (adult) (pediatric): Secondary | ICD-10-CM | POA: Diagnosis not present

## 2012-04-26 DIAGNOSIS — I2510 Atherosclerotic heart disease of native coronary artery without angina pectoris: Secondary | ICD-10-CM | POA: Diagnosis not present

## 2012-04-26 DIAGNOSIS — E785 Hyperlipidemia, unspecified: Secondary | ICD-10-CM | POA: Diagnosis not present

## 2012-05-04 DIAGNOSIS — I252 Old myocardial infarction: Secondary | ICD-10-CM | POA: Diagnosis not present

## 2012-05-04 DIAGNOSIS — N189 Chronic kidney disease, unspecified: Secondary | ICD-10-CM | POA: Diagnosis not present

## 2012-05-04 DIAGNOSIS — I10 Essential (primary) hypertension: Secondary | ICD-10-CM | POA: Diagnosis not present

## 2012-05-04 DIAGNOSIS — R0989 Other specified symptoms and signs involving the circulatory and respiratory systems: Secondary | ICD-10-CM | POA: Diagnosis not present

## 2012-05-04 DIAGNOSIS — Z79899 Other long term (current) drug therapy: Secondary | ICD-10-CM | POA: Diagnosis not present

## 2012-05-04 DIAGNOSIS — I2510 Atherosclerotic heart disease of native coronary artery without angina pectoris: Secondary | ICD-10-CM | POA: Diagnosis not present

## 2012-05-04 DIAGNOSIS — I2789 Other specified pulmonary heart diseases: Secondary | ICD-10-CM | POA: Diagnosis not present

## 2012-05-04 DIAGNOSIS — G4733 Obstructive sleep apnea (adult) (pediatric): Secondary | ICD-10-CM | POA: Diagnosis not present

## 2012-05-12 DIAGNOSIS — I10 Essential (primary) hypertension: Secondary | ICD-10-CM | POA: Diagnosis not present

## 2012-05-13 DIAGNOSIS — E119 Type 2 diabetes mellitus without complications: Secondary | ICD-10-CM

## 2012-05-13 HISTORY — DX: Type 2 diabetes mellitus without complications: E11.9

## 2012-05-16 DIAGNOSIS — I10 Essential (primary) hypertension: Secondary | ICD-10-CM | POA: Diagnosis not present

## 2012-05-16 DIAGNOSIS — R209 Unspecified disturbances of skin sensation: Secondary | ICD-10-CM | POA: Diagnosis not present

## 2012-05-16 DIAGNOSIS — R7309 Other abnormal glucose: Secondary | ICD-10-CM | POA: Diagnosis not present

## 2012-05-16 DIAGNOSIS — R1011 Right upper quadrant pain: Secondary | ICD-10-CM | POA: Diagnosis not present

## 2012-05-16 DIAGNOSIS — Z23 Encounter for immunization: Secondary | ICD-10-CM | POA: Diagnosis not present

## 2012-05-19 DIAGNOSIS — R1011 Right upper quadrant pain: Secondary | ICD-10-CM | POA: Diagnosis not present

## 2012-05-26 DIAGNOSIS — E538 Deficiency of other specified B group vitamins: Secondary | ICD-10-CM | POA: Diagnosis not present

## 2012-05-31 DIAGNOSIS — N2581 Secondary hyperparathyroidism of renal origin: Secondary | ICD-10-CM | POA: Diagnosis not present

## 2012-05-31 DIAGNOSIS — I2510 Atherosclerotic heart disease of native coronary artery without angina pectoris: Secondary | ICD-10-CM | POA: Diagnosis not present

## 2012-05-31 DIAGNOSIS — I252 Old myocardial infarction: Secondary | ICD-10-CM | POA: Diagnosis not present

## 2012-05-31 DIAGNOSIS — D649 Anemia, unspecified: Secondary | ICD-10-CM | POA: Diagnosis not present

## 2012-05-31 DIAGNOSIS — N189 Chronic kidney disease, unspecified: Secondary | ICD-10-CM | POA: Diagnosis not present

## 2012-05-31 DIAGNOSIS — R0989 Other specified symptoms and signs involving the circulatory and respiratory systems: Secondary | ICD-10-CM | POA: Diagnosis not present

## 2012-05-31 DIAGNOSIS — I10 Essential (primary) hypertension: Secondary | ICD-10-CM | POA: Diagnosis not present

## 2012-05-31 DIAGNOSIS — N184 Chronic kidney disease, stage 4 (severe): Secondary | ICD-10-CM | POA: Diagnosis not present

## 2012-06-04 DIAGNOSIS — R079 Chest pain, unspecified: Secondary | ICD-10-CM | POA: Diagnosis not present

## 2012-06-04 DIAGNOSIS — N289 Disorder of kidney and ureter, unspecified: Secondary | ICD-10-CM | POA: Diagnosis not present

## 2012-06-04 DIAGNOSIS — I5033 Acute on chronic diastolic (congestive) heart failure: Secondary | ICD-10-CM | POA: Diagnosis not present

## 2012-06-04 DIAGNOSIS — N189 Chronic kidney disease, unspecified: Secondary | ICD-10-CM | POA: Diagnosis not present

## 2012-06-04 DIAGNOSIS — I130 Hypertensive heart and chronic kidney disease with heart failure and stage 1 through stage 4 chronic kidney disease, or unspecified chronic kidney disease: Secondary | ICD-10-CM | POA: Diagnosis not present

## 2012-06-04 DIAGNOSIS — N179 Acute kidney failure, unspecified: Secondary | ICD-10-CM | POA: Diagnosis not present

## 2012-06-04 DIAGNOSIS — E875 Hyperkalemia: Secondary | ICD-10-CM | POA: Diagnosis not present

## 2012-06-04 DIAGNOSIS — IMO0001 Reserved for inherently not codable concepts without codable children: Secondary | ICD-10-CM | POA: Diagnosis not present

## 2012-06-04 DIAGNOSIS — N184 Chronic kidney disease, stage 4 (severe): Secondary | ICD-10-CM | POA: Diagnosis not present

## 2012-06-04 DIAGNOSIS — R0989 Other specified symptoms and signs involving the circulatory and respiratory systems: Secondary | ICD-10-CM | POA: Diagnosis not present

## 2012-06-04 DIAGNOSIS — I2510 Atherosclerotic heart disease of native coronary artery without angina pectoris: Secondary | ICD-10-CM | POA: Diagnosis not present

## 2012-06-04 DIAGNOSIS — R0609 Other forms of dyspnea: Secondary | ICD-10-CM | POA: Diagnosis not present

## 2012-06-04 DIAGNOSIS — E119 Type 2 diabetes mellitus without complications: Secondary | ICD-10-CM | POA: Diagnosis not present

## 2012-06-04 DIAGNOSIS — I509 Heart failure, unspecified: Secondary | ICD-10-CM | POA: Diagnosis not present

## 2012-06-05 DIAGNOSIS — N189 Chronic kidney disease, unspecified: Secondary | ICD-10-CM | POA: Diagnosis not present

## 2012-06-05 DIAGNOSIS — R0789 Other chest pain: Secondary | ICD-10-CM | POA: Diagnosis not present

## 2012-06-05 DIAGNOSIS — N184 Chronic kidney disease, stage 4 (severe): Secondary | ICD-10-CM | POA: Diagnosis not present

## 2012-06-05 DIAGNOSIS — E119 Type 2 diabetes mellitus without complications: Secondary | ICD-10-CM | POA: Diagnosis not present

## 2012-06-05 DIAGNOSIS — I5031 Acute diastolic (congestive) heart failure: Secondary | ICD-10-CM | POA: Diagnosis not present

## 2012-06-05 DIAGNOSIS — E875 Hyperkalemia: Secondary | ICD-10-CM | POA: Diagnosis not present

## 2012-06-05 DIAGNOSIS — R9431 Abnormal electrocardiogram [ECG] [EKG]: Secondary | ICD-10-CM | POA: Diagnosis not present

## 2012-06-05 DIAGNOSIS — N179 Acute kidney failure, unspecified: Secondary | ICD-10-CM | POA: Diagnosis not present

## 2012-06-05 DIAGNOSIS — I130 Hypertensive heart and chronic kidney disease with heart failure and stage 1 through stage 4 chronic kidney disease, or unspecified chronic kidney disease: Secondary | ICD-10-CM | POA: Diagnosis not present

## 2012-06-05 DIAGNOSIS — I501 Left ventricular failure: Secondary | ICD-10-CM | POA: Diagnosis not present

## 2012-06-05 DIAGNOSIS — I2510 Atherosclerotic heart disease of native coronary artery without angina pectoris: Secondary | ICD-10-CM | POA: Diagnosis not present

## 2012-06-05 DIAGNOSIS — I5033 Acute on chronic diastolic (congestive) heart failure: Secondary | ICD-10-CM | POA: Diagnosis not present

## 2012-06-05 DIAGNOSIS — I509 Heart failure, unspecified: Secondary | ICD-10-CM | POA: Diagnosis not present

## 2012-06-05 DIAGNOSIS — R079 Chest pain, unspecified: Secondary | ICD-10-CM | POA: Diagnosis not present

## 2012-06-06 DIAGNOSIS — G4733 Obstructive sleep apnea (adult) (pediatric): Secondary | ICD-10-CM | POA: Diagnosis present

## 2012-06-06 DIAGNOSIS — E785 Hyperlipidemia, unspecified: Secondary | ICD-10-CM | POA: Diagnosis present

## 2012-06-06 DIAGNOSIS — E875 Hyperkalemia: Secondary | ICD-10-CM | POA: Diagnosis not present

## 2012-06-06 DIAGNOSIS — E119 Type 2 diabetes mellitus without complications: Secondary | ICD-10-CM | POA: Diagnosis present

## 2012-06-06 DIAGNOSIS — N179 Acute kidney failure, unspecified: Secondary | ICD-10-CM | POA: Diagnosis not present

## 2012-06-06 DIAGNOSIS — Z87891 Personal history of nicotine dependence: Secondary | ICD-10-CM | POA: Diagnosis not present

## 2012-06-06 DIAGNOSIS — I2510 Atherosclerotic heart disease of native coronary artery without angina pectoris: Secondary | ICD-10-CM | POA: Diagnosis not present

## 2012-06-06 DIAGNOSIS — I130 Hypertensive heart and chronic kidney disease with heart failure and stage 1 through stage 4 chronic kidney disease, or unspecified chronic kidney disease: Secondary | ICD-10-CM | POA: Diagnosis not present

## 2012-06-06 DIAGNOSIS — Z6832 Body mass index (BMI) 32.0-32.9, adult: Secondary | ICD-10-CM | POA: Diagnosis not present

## 2012-06-06 DIAGNOSIS — I501 Left ventricular failure: Secondary | ICD-10-CM | POA: Diagnosis not present

## 2012-06-06 DIAGNOSIS — Z9861 Coronary angioplasty status: Secondary | ICD-10-CM | POA: Diagnosis not present

## 2012-06-06 DIAGNOSIS — I10 Essential (primary) hypertension: Secondary | ICD-10-CM | POA: Diagnosis not present

## 2012-06-06 DIAGNOSIS — R079 Chest pain, unspecified: Secondary | ICD-10-CM | POA: Diagnosis not present

## 2012-06-06 DIAGNOSIS — I5031 Acute diastolic (congestive) heart failure: Secondary | ICD-10-CM | POA: Diagnosis not present

## 2012-06-06 DIAGNOSIS — I509 Heart failure, unspecified: Secondary | ICD-10-CM | POA: Diagnosis present

## 2012-06-06 DIAGNOSIS — N184 Chronic kidney disease, stage 4 (severe): Secondary | ICD-10-CM | POA: Diagnosis not present

## 2012-06-06 DIAGNOSIS — E1159 Type 2 diabetes mellitus with other circulatory complications: Secondary | ICD-10-CM | POA: Diagnosis not present

## 2012-06-06 DIAGNOSIS — I5033 Acute on chronic diastolic (congestive) heart failure: Secondary | ICD-10-CM | POA: Diagnosis not present

## 2012-06-06 DIAGNOSIS — N189 Chronic kidney disease, unspecified: Secondary | ICD-10-CM | POA: Diagnosis not present

## 2012-06-13 DIAGNOSIS — I252 Old myocardial infarction: Secondary | ICD-10-CM | POA: Diagnosis not present

## 2012-06-13 DIAGNOSIS — E1129 Type 2 diabetes mellitus with other diabetic kidney complication: Secondary | ICD-10-CM | POA: Diagnosis not present

## 2012-06-14 DIAGNOSIS — I2510 Atherosclerotic heart disease of native coronary artery without angina pectoris: Secondary | ICD-10-CM | POA: Diagnosis not present

## 2012-06-14 DIAGNOSIS — E119 Type 2 diabetes mellitus without complications: Secondary | ICD-10-CM | POA: Diagnosis not present

## 2012-06-14 DIAGNOSIS — N189 Chronic kidney disease, unspecified: Secondary | ICD-10-CM | POA: Diagnosis not present

## 2012-06-14 DIAGNOSIS — I509 Heart failure, unspecified: Secondary | ICD-10-CM | POA: Diagnosis not present

## 2012-06-17 DIAGNOSIS — I2510 Atherosclerotic heart disease of native coronary artery without angina pectoris: Secondary | ICD-10-CM | POA: Diagnosis not present

## 2012-06-17 DIAGNOSIS — I509 Heart failure, unspecified: Secondary | ICD-10-CM | POA: Diagnosis not present

## 2012-06-17 DIAGNOSIS — N189 Chronic kidney disease, unspecified: Secondary | ICD-10-CM | POA: Diagnosis not present

## 2012-06-17 DIAGNOSIS — E119 Type 2 diabetes mellitus without complications: Secondary | ICD-10-CM | POA: Diagnosis not present

## 2012-06-21 DIAGNOSIS — E785 Hyperlipidemia, unspecified: Secondary | ICD-10-CM | POA: Diagnosis not present

## 2012-06-21 DIAGNOSIS — Z79899 Other long term (current) drug therapy: Secondary | ICD-10-CM | POA: Diagnosis not present

## 2012-06-21 DIAGNOSIS — I10 Essential (primary) hypertension: Secondary | ICD-10-CM | POA: Diagnosis not present

## 2012-06-21 DIAGNOSIS — I252 Old myocardial infarction: Secondary | ICD-10-CM | POA: Diagnosis not present

## 2012-06-21 DIAGNOSIS — R0989 Other specified symptoms and signs involving the circulatory and respiratory systems: Secondary | ICD-10-CM | POA: Diagnosis not present

## 2012-06-21 DIAGNOSIS — G4733 Obstructive sleep apnea (adult) (pediatric): Secondary | ICD-10-CM | POA: Diagnosis not present

## 2012-06-21 DIAGNOSIS — I2510 Atherosclerotic heart disease of native coronary artery without angina pectoris: Secondary | ICD-10-CM | POA: Diagnosis not present

## 2012-06-21 DIAGNOSIS — N189 Chronic kidney disease, unspecified: Secondary | ICD-10-CM | POA: Diagnosis not present

## 2012-06-22 DIAGNOSIS — N189 Chronic kidney disease, unspecified: Secondary | ICD-10-CM | POA: Diagnosis not present

## 2012-06-22 DIAGNOSIS — I2510 Atherosclerotic heart disease of native coronary artery without angina pectoris: Secondary | ICD-10-CM | POA: Diagnosis not present

## 2012-06-22 DIAGNOSIS — I509 Heart failure, unspecified: Secondary | ICD-10-CM | POA: Diagnosis not present

## 2012-06-22 DIAGNOSIS — E119 Type 2 diabetes mellitus without complications: Secondary | ICD-10-CM | POA: Diagnosis not present

## 2012-06-24 DIAGNOSIS — I2510 Atherosclerotic heart disease of native coronary artery without angina pectoris: Secondary | ICD-10-CM | POA: Diagnosis not present

## 2012-06-24 DIAGNOSIS — N189 Chronic kidney disease, unspecified: Secondary | ICD-10-CM | POA: Diagnosis not present

## 2012-06-24 DIAGNOSIS — I509 Heart failure, unspecified: Secondary | ICD-10-CM | POA: Diagnosis not present

## 2012-06-24 DIAGNOSIS — E119 Type 2 diabetes mellitus without complications: Secondary | ICD-10-CM | POA: Diagnosis not present

## 2012-06-27 DIAGNOSIS — Z79899 Other long term (current) drug therapy: Secondary | ICD-10-CM | POA: Diagnosis not present

## 2012-06-27 DIAGNOSIS — I10 Essential (primary) hypertension: Secondary | ICD-10-CM | POA: Diagnosis not present

## 2012-06-27 DIAGNOSIS — I2510 Atherosclerotic heart disease of native coronary artery without angina pectoris: Secondary | ICD-10-CM | POA: Diagnosis not present

## 2012-06-29 DIAGNOSIS — I2510 Atherosclerotic heart disease of native coronary artery without angina pectoris: Secondary | ICD-10-CM | POA: Diagnosis not present

## 2012-06-29 DIAGNOSIS — I509 Heart failure, unspecified: Secondary | ICD-10-CM | POA: Diagnosis not present

## 2012-06-29 DIAGNOSIS — N189 Chronic kidney disease, unspecified: Secondary | ICD-10-CM | POA: Diagnosis not present

## 2012-06-29 DIAGNOSIS — E119 Type 2 diabetes mellitus without complications: Secondary | ICD-10-CM | POA: Diagnosis not present

## 2012-07-01 ENCOUNTER — Encounter (HOSPITAL_COMMUNITY): Payer: Self-pay | Admitting: General Practice

## 2012-07-01 ENCOUNTER — Inpatient Hospital Stay (HOSPITAL_COMMUNITY)
Admission: AD | Admit: 2012-07-01 | Discharge: 2012-07-04 | DRG: 291 | Disposition: A | Discharge status: Home / Self Care | Payer: Medicare Other | Source: Other Acute Inpatient Hospital | Attending: Internal Medicine | Admitting: Internal Medicine

## 2012-07-01 DIAGNOSIS — I509 Heart failure, unspecified: Secondary | ICD-10-CM

## 2012-07-01 DIAGNOSIS — R600 Localized edema: Secondary | ICD-10-CM | POA: Diagnosis present

## 2012-07-01 DIAGNOSIS — M109 Gout, unspecified: Secondary | ICD-10-CM | POA: Diagnosis present

## 2012-07-01 DIAGNOSIS — K219 Gastro-esophageal reflux disease without esophagitis: Secondary | ICD-10-CM | POA: Diagnosis not present

## 2012-07-01 DIAGNOSIS — I5031 Acute diastolic (congestive) heart failure: Principal | ICD-10-CM

## 2012-07-01 DIAGNOSIS — E119 Type 2 diabetes mellitus without complications: Secondary | ICD-10-CM | POA: Diagnosis not present

## 2012-07-01 DIAGNOSIS — N2581 Secondary hyperparathyroidism of renal origin: Secondary | ICD-10-CM | POA: Diagnosis present

## 2012-07-01 DIAGNOSIS — D649 Anemia, unspecified: Secondary | ICD-10-CM | POA: Diagnosis present

## 2012-07-01 DIAGNOSIS — R0989 Other specified symptoms and signs involving the circulatory and respiratory systems: Secondary | ICD-10-CM | POA: Diagnosis not present

## 2012-07-01 DIAGNOSIS — R6889 Other general symptoms and signs: Secondary | ICD-10-CM | POA: Diagnosis not present

## 2012-07-01 DIAGNOSIS — E669 Obesity, unspecified: Secondary | ICD-10-CM

## 2012-07-01 DIAGNOSIS — R609 Edema, unspecified: Secondary | ICD-10-CM | POA: Diagnosis not present

## 2012-07-01 DIAGNOSIS — F3289 Other specified depressive episodes: Secondary | ICD-10-CM | POA: Diagnosis present

## 2012-07-01 DIAGNOSIS — Z79899 Other long term (current) drug therapy: Secondary | ICD-10-CM | POA: Diagnosis not present

## 2012-07-01 DIAGNOSIS — F411 Generalized anxiety disorder: Secondary | ICD-10-CM | POA: Diagnosis present

## 2012-07-01 DIAGNOSIS — N189 Chronic kidney disease, unspecified: Secondary | ICD-10-CM | POA: Diagnosis not present

## 2012-07-01 DIAGNOSIS — F329 Major depressive disorder, single episode, unspecified: Secondary | ICD-10-CM | POA: Diagnosis present

## 2012-07-01 DIAGNOSIS — M129 Arthropathy, unspecified: Secondary | ICD-10-CM | POA: Diagnosis present

## 2012-07-01 DIAGNOSIS — I059 Rheumatic mitral valve disease, unspecified: Secondary | ICD-10-CM | POA: Diagnosis not present

## 2012-07-01 DIAGNOSIS — I10 Essential (primary) hypertension: Secondary | ICD-10-CM | POA: Diagnosis present

## 2012-07-01 DIAGNOSIS — G4733 Obstructive sleep apnea (adult) (pediatric): Secondary | ICD-10-CM | POA: Diagnosis present

## 2012-07-01 DIAGNOSIS — Z87891 Personal history of nicotine dependence: Secondary | ICD-10-CM

## 2012-07-01 DIAGNOSIS — I2510 Atherosclerotic heart disease of native coronary artery without angina pectoris: Secondary | ICD-10-CM | POA: Diagnosis not present

## 2012-07-01 DIAGNOSIS — N186 End stage renal disease: Secondary | ICD-10-CM | POA: Diagnosis not present

## 2012-07-01 DIAGNOSIS — Z6837 Body mass index (BMI) 37.0-37.9, adult: Secondary | ICD-10-CM | POA: Diagnosis not present

## 2012-07-01 DIAGNOSIS — R0602 Shortness of breath: Secondary | ICD-10-CM

## 2012-07-01 DIAGNOSIS — E876 Hypokalemia: Secondary | ICD-10-CM | POA: Diagnosis present

## 2012-07-01 DIAGNOSIS — Z7982 Long term (current) use of aspirin: Secondary | ICD-10-CM | POA: Diagnosis not present

## 2012-07-01 DIAGNOSIS — N19 Unspecified kidney failure: Secondary | ICD-10-CM | POA: Diagnosis not present

## 2012-07-01 DIAGNOSIS — E785 Hyperlipidemia, unspecified: Secondary | ICD-10-CM | POA: Diagnosis present

## 2012-07-01 DIAGNOSIS — I252 Old myocardial infarction: Secondary | ICD-10-CM | POA: Diagnosis not present

## 2012-07-01 DIAGNOSIS — E7800 Pure hypercholesterolemia, unspecified: Secondary | ICD-10-CM | POA: Diagnosis not present

## 2012-07-01 DIAGNOSIS — I120 Hypertensive chronic kidney disease with stage 5 chronic kidney disease or end stage renal disease: Secondary | ICD-10-CM | POA: Diagnosis present

## 2012-07-01 DIAGNOSIS — I5030 Unspecified diastolic (congestive) heart failure: Secondary | ICD-10-CM | POA: Diagnosis present

## 2012-07-01 DIAGNOSIS — I129 Hypertensive chronic kidney disease with stage 1 through stage 4 chronic kidney disease, or unspecified chronic kidney disease: Secondary | ICD-10-CM | POA: Diagnosis not present

## 2012-07-01 DIAGNOSIS — R0609 Other forms of dyspnea: Secondary | ICD-10-CM | POA: Diagnosis not present

## 2012-07-01 DIAGNOSIS — Z794 Long term (current) use of insulin: Secondary | ICD-10-CM | POA: Diagnosis not present

## 2012-07-01 DIAGNOSIS — N184 Chronic kidney disease, stage 4 (severe): Secondary | ICD-10-CM | POA: Diagnosis present

## 2012-07-01 HISTORY — DX: Dyspnea, unspecified: R06.00

## 2012-07-01 HISTORY — DX: End stage renal disease: N18.6

## 2012-07-01 HISTORY — DX: Acute myocardial infarction, unspecified: I21.9

## 2012-07-01 HISTORY — DX: Gastro-esophageal reflux disease without esophagitis: K21.9

## 2012-07-01 HISTORY — DX: Acute embolism and thrombosis of unspecified deep veins of unspecified lower extremity: I82.409

## 2012-07-01 HISTORY — DX: Gout, unspecified: M10.9

## 2012-07-01 HISTORY — DX: Shortness of breath: R06.02

## 2012-07-01 HISTORY — DX: Type 2 diabetes mellitus without complications: E11.9

## 2012-07-01 HISTORY — DX: Personal history of other medical treatment: Z92.89

## 2012-07-01 HISTORY — DX: Carpal tunnel syndrome, bilateral upper limbs: G56.03

## 2012-07-01 HISTORY — DX: Dependence on other enabling machines and devices: Z99.89

## 2012-07-01 HISTORY — DX: Localized edema: R60.0

## 2012-07-01 HISTORY — DX: Iron deficiency anemia: D50

## 2012-07-01 HISTORY — DX: Acute diastolic (congestive) heart failure: I50.31

## 2012-07-01 HISTORY — DX: Obesity, unspecified: E66.9

## 2012-07-01 HISTORY — DX: Angina pectoris, unspecified: I20.9

## 2012-07-01 HISTORY — DX: Migraine, unspecified, not intractable, without status migrainosus: G43.909

## 2012-07-01 HISTORY — DX: Other forms of dyspnea: R06.09

## 2012-07-01 HISTORY — DX: Obstructive sleep apnea (adult) (pediatric): G47.33

## 2012-07-01 HISTORY — DX: Heart failure, unspecified: I50.9

## 2012-07-01 HISTORY — DX: Essential (primary) hypertension: I10

## 2012-07-01 HISTORY — DX: Vitamin B12 deficiency anemia, unspecified: D51.9

## 2012-07-01 LAB — CBC
HCT: 35.2 % — ABNORMAL LOW (ref 39.0–52.0)
Hemoglobin: 11.9 g/dL — ABNORMAL LOW (ref 13.0–17.0)
MCH: 28.9 pg (ref 26.0–34.0)
MCHC: 33.8 g/dL (ref 30.0–36.0)
MCV: 85.4 fL (ref 78.0–100.0)
RDW: 13.9 % (ref 11.5–15.5)

## 2012-07-01 LAB — COMPREHENSIVE METABOLIC PANEL
CO2: 30 mEq/L (ref 19–32)
Calcium: 9.6 mg/dL (ref 8.4–10.5)
Chloride: 97 mEq/L (ref 96–112)
Creatinine, Ser: 3.26 mg/dL — ABNORMAL HIGH (ref 0.50–1.35)
GFR calc Af Amer: 25 mL/min — ABNORMAL LOW (ref >90)
GFR calc non Af Amer: 21 mL/min — ABNORMAL LOW (ref >90)
Glucose, Bld: 172 mg/dL — ABNORMAL HIGH (ref 70–99)
Total Bilirubin: 0.3 mg/dL (ref 0.3–1.2)

## 2012-07-01 LAB — GLUCOSE, CAPILLARY: Glucose-Capillary: 191 mg/dL — ABNORMAL HIGH (ref 70–99)

## 2012-07-01 LAB — MRSA PCR SCREENING: MRSA by PCR: NEGATIVE

## 2012-07-01 MED ORDER — INSULIN ASPART 100 UNIT/ML ~~LOC~~ SOLN: 0.0000 [IU] | Freq: Three times a day (TID) | SUBCUTANEOUS | Status: DC

## 2012-07-01 MED ORDER — ACETAMINOPHEN 325 MG PO TABS: 650.0000 mg | ORAL_TABLET | ORAL | Status: DC | PRN

## 2012-07-01 MED ORDER — FUROSEMIDE 10 MG/ML IJ SOLN
80.0000 mg | Freq: Two times a day (BID) | INTRAMUSCULAR | Status: AC
  Administered 2012-07-01 – 2012-07-03 (×4): 80 mg via INTRAVENOUS
  Filled 2012-07-01 (×4): qty 8

## 2012-07-01 MED ORDER — SODIUM CHLORIDE 0.9 % IV SOLN: 250.0000 mL | INTRAVENOUS | Status: DC | PRN

## 2012-07-01 MED ORDER — SODIUM CHLORIDE 0.9 % IJ SOLN: 3.0000 mL | INTRAMUSCULAR | Status: DC | PRN

## 2012-07-01 MED ORDER — INSULIN ASPART 100 UNIT/ML ~~LOC~~ SOLN: 0.0000 [IU] | Freq: Every day | SUBCUTANEOUS | Status: DC

## 2012-07-01 MED ORDER — ONDANSETRON HCL 4 MG/2ML IJ SOLN
4.0000 mg | Freq: Four times a day (QID) | INTRAMUSCULAR | Status: DC | PRN
  Administered 2012-07-02 – 2012-07-03 (×2): 4 mg via INTRAVENOUS
  Filled 2012-07-01 (×2): qty 2

## 2012-07-01 MED ORDER — CARVEDILOL 3.125 MG PO TABS
3.1250 mg | ORAL_TABLET | Freq: Two times a day (BID) | ORAL | Status: DC
  Administered 2012-07-02: 3.125 mg via ORAL
  Filled 2012-07-01 (×3): qty 1

## 2012-07-01 MED ORDER — POTASSIUM CHLORIDE CRYS ER 10 MEQ PO TBCR
10.0000 meq | EXTENDED_RELEASE_TABLET | Freq: Two times a day (BID) | ORAL | Status: AC
  Administered 2012-07-01 – 2012-07-03 (×4): 10 meq via ORAL
  Filled 2012-07-01 (×5): qty 1

## 2012-07-01 MED ORDER — ENOXAPARIN SODIUM 30 MG/0.3ML ~~LOC~~ SOLN
30.0000 mg | SUBCUTANEOUS | Status: DC
  Administered 2012-07-01: 30 mg via SUBCUTANEOUS
  Filled 2012-07-01 (×2): qty 0.3

## 2012-07-01 MED ORDER — SODIUM CHLORIDE 0.9 % IJ SOLN
3.0000 mL | Freq: Two times a day (BID) | INTRAMUSCULAR | Status: DC
  Administered 2012-07-01 – 2012-07-04 (×6): 3 mL via INTRAVENOUS

## 2012-07-01 MED ORDER — ZOLPIDEM TARTRATE 5 MG PO TABS
10.0000 mg | ORAL_TABLET | Freq: Every evening | ORAL | Status: DC | PRN
  Administered 2012-07-02 – 2012-07-03 (×3): 10 mg via ORAL
  Filled 2012-07-01 (×3): qty 2

## 2012-07-01 NOTE — H&P (Signed)
[facility] History and Physical  Carlos Heath ZOX:[id number] DOB: 10-30-1965 DOA: 07/01/2012  Referring physician:  PCP: [name], MD  Specialists:   Chief Complaint:    SOB  HPI: Carlos Heath is a 46 y.o. male with DM2, and ESRD who complains of worsening SOB, and Edema  In lower legs over the past week.  He was told by his cardiologist to go to ED.  He was transferred from [facility] ED to [facility] since he has ESRD, he is not yet on dialysis and is followed by Dr. Hyman Hopes of [facility].     Review of Systems: The patient denies anorexia, fever, weight loss, vision loss, decreased hearing, hoarseness, chest pain, syncope, dyspnea on exertion, balance deficits, hemoptysis, abdominal pain, melena, hematochezia,hematemesis, nausea, vomiting, diarrhea, constipation, severe indigestion/heartburn, hematuria, incontinence, dysuria,  genital sores, muscle weakness, suspicious skin lesions, transient blindness, difficulty walking, depression, unusual weight change, abnormal bleeding, enlarged lymph nodes, angioedema, and breast masses.    Past Medical History  Diagnosis Date  . Chronic kidney disease   . CAD (coronary artery disease)   . Carotid artery occlusion   . Hypertension   . Hyperlipidemia   . Anemia   . Depression   . Arthritis   . Heart murmur   . Anxiety   . Sleep apnea     Tested at [facility].....cardiology ordered it  . Complication of anesthesia     difficulty urinating   Past Surgical History  Procedure Date  . Av fistula placement   . Cervical spine surgery   . Stents     cardiac stents placed 2011    Medications:  HOME MEDS: Prior to Admission medications   Medication Sig Start Date End Date Taking? Authorizing Provider  aspirin 325 MG EC tablet Take 325 mg by mouth daily.   Yes Historical Provider, MD  atorvastatin (LIPITOR) 40 MG tablet Take 40 mg by mouth every evening.   Yes Historical Provider, MD  calcium acetate  (PHOSLO) 667 MG capsule Take 1,334 mg by mouth 3 (three) times daily with meals.   Yes Historical Provider, MD  cloNIDine (CATAPRES) 0.1 MG tablet Take 0.2 mg by mouth 3 (three) times daily.    Yes Historical Provider, MD  desvenlafaxine (PRISTIQ) 50 MG 24 hr tablet Take 100 mg by mouth every evening.    Yes Historical Provider, MD  dicyclomine (BENTYL) 10 MG capsule Take 10 mg by mouth 4 (four) times daily -  before meals and at bedtime.   Yes Historical Provider, MD  febuxostat (ULORIC) 40 MG tablet Take 40 mg by mouth daily.    Yes Historical Provider, MD  furosemide (LASIX) 80 MG tablet Take 160 mg by mouth 3 (three) times daily.    Yes Historical Provider, MD  hydrALAZINE (APRESOLINE) 100 MG tablet Take 100 mg by mouth every 6 (six) hours.    Yes Historical Provider, MD  insulin glargine (LANTUS) 100 UNIT/ML injection Inject 20 Units into the skin 2 (two) times daily as needed. Sliding scale   Yes Historical Provider, MD  labetalol (NORMODYNE) 300 MG tablet Take 300 mg by mouth 2 (two) times daily.   Yes Historical Provider, MD  metolazone (ZAROXOLYN) 2.5 MG tablet Take 2.5-5 mg by mouth 3 (three) times a week. Mon, Wed, and Fri   Yes Historical Provider, MD  minoxidil (LONITEN) 10 MG tablet Take 10 mg by mouth 2 (two) times daily.     Yes Historical Provider, MD  nitroGLYCERIN (  NITROSTAT) 0.4 MG SL tablet Place 0.4 mg under the tongue every 5 (five) minutes as needed. For chest pain   Yes Historical Provider, MD  omeprazole (PRILOSEC) 40 MG capsule Take 40 mg by mouth 2 (two) times daily.    Yes Historical Provider, MD  ondansetron (ZOFRAN) 8 MG tablet Take 8 mg by mouth every 8 (eight) hours as needed. For nausea   Yes Historical Provider, MD  oxycodone (OXY-IR) 5 MG capsule Take 5-10 mg by mouth every 4 (four) hours as needed. For pain   Yes Historical Provider, MD  potassium chloride SA (K-DUR,KLOR-CON) 20 MEQ tablet Take 80 mEq by mouth 2 (two) times daily.    Yes Historical Provider, MD   predniSONE (DELTASONE) 20 MG tablet Take 20 mg by mouth daily as needed. For gout pain   Yes Historical Provider, MD  ranolazine (RANEXA) 1000 MG SR tablet Take 1,000 mg by mouth 2 (two) times daily.    Yes Historical Provider, MD  zolpidem (AMBIEN) 10 MG tablet Take 10 mg by mouth at bedtime.    Yes Historical Provider, MD    Allergies:  Allergies  Allergen Reactions  . Lisinopril Rash  . Hydrocodone Nausea And Vomiting  . Morphine And Related Nausea And Vomiting  . Norvasc (Amlodipine Besylate) Hives    Social History:   reports that he quit smoking about 17 months ago. His smoking use included Cigarettes. He has a 35 pack-year smoking history. He does not have any smokeless tobacco history on file. He reports that he does not drink alcohol or use illicit drugs.   Family History  Problem Relation Age of Onset  . Heart disease Mother     before age 55  . Stroke Mother   . Hyperlipidemia Mother   . Hypertension Mother   . Heart attack Mother   . Other Mother     bleeding problems  . Heart disease Father     before age 84  . Hyperlipidemia Father   . Hypertension Father   . Heart attack Father   . Hypertension Brother       Physical Exam:  GEN:  Pleasant 47 year old Obese Caucasian Male examined and in no acute distress; cooperative with exam Filed Vitals:   07/01/12 1937  BP: 161/68  Pulse: 76  Temp: 98 F (36.7 C)  TempSrc: Axillary  Resp: 18  Height: 5\' 8"  (1.727 m)  Weight: 117.028 kg (258 lb)  SpO2: 99%   Blood pressure 161/68, pulse 76, temperature 98 F (36.7 C), temperature source Axillary, resp. rate 18, height 5\' 8"  (1.727 m), weight 117.028 kg (258 lb), SpO2 99.00%. PSYCH: He is alert and oriented x4; does not appear anxious does not appear depressed; affect is normal HEENT: Normocephalic and Atraumatic, Mucous membranes pink; PERRLA; EOM intact; Fundi:  Benign;  No scleral icterus, Nares: Patent, Oropharynx: Clear, Fair Dentition, Neck:  FROM, no  cervical lymphadenopathy nor thyromegaly or carotid bruit; no JVD; Breasts:: Not examined CHEST WALL: No tenderness CHEST: Normal respiration, clear to auscultation bilaterally HEART: Regular rate and rhythm; no murmurs rubs or gallops BACK: No kyphosis or scoliosis; no CVA tenderness ABDOMEN: Positive Bowel Sounds, Obese, soft non-tender; no masses, no organomegaly.    Rectal Exam: Not done EXTREMITIES: No bone or joint deformity; age-appropriate arthropathy of the hands and knees; no cyanosis, clubbing or 2+ EDEMA BLE to Pretibial Areas, edema; no ulcerations. Genitalia: not examined PULSES: 2+ and symmetric SKIN: Normal hydration no rash or ulceration CNS: Cranial  nerves 2-12 grossly intact no focal neurologic deficit    Labs on Admission:  Basic Metabolic Panel: No results found for this basename: NA:5,K:5,CL:5,CO2:5,GLUCOSE:5,BUN:5,CREATININE:5,CALCIUM:5,MG:5,PHOS:5 in the last 168 hours Liver Function Tests: No results found for this basename: AST:5,ALT:5,ALKPHOS:5,BILITOT:5,PROT:5,ALBUMIN:5 in the last 168 hours No results found for this basename: LIPASE:5,AMYLASE:5 in the last 168 hours No results found for this basename: AMMONIA:5 in the last 168 hours CBC: No results found for this basename: WBC:5,NEUTROABS:5,HGB:5,HCT:5,MCV:5,PLT:5 in the last 168 hours Cardiac Enzymes: No results found for this basename: CKTOTAL:5,CKMB:5,CKMBINDEX:5,TROPONINI:5 in the last 168 hours  BNP (last 3 results) No results found for this basename: PROBNP:3 in the last 8760 hours CBG: No results found for this basename: GLUCAP:5 in the last 168 hours  Radiological Exams on Admission: No results found.  EKG:     Assessment: Principal Problem:  *Acute diastolic CHF (congestive heart failure) Active Problems:  SOB (shortness of breath)  Chronic kidney disease (CKD), stage IV (severe)  Edema extremities  Diabetes mellitus  Hypertension  Obesity    Plan:    Admit to Telemetry  Bed CHF Protocol Diurese with IV Lasix K supplementation Monitor Electrolytes 2D ECHO w/o Ctx ordered Consult [facility] SSI coverage DT Prophylaxis Reconcile Home medications    Code Status:  FULL CODE Family Communication:  N/A Disposition Plan:  Return to Home  Time spent: 58 Minutes  Ron Parker [facility] Pager [phone]  If 7PM-7AM, please contact night-coverage [email or web address] Password [facility] 07/01/2012, 8:35 PM

## 2012-07-02 DIAGNOSIS — I059 Rheumatic mitral valve disease, unspecified: Secondary | ICD-10-CM

## 2012-07-02 DIAGNOSIS — I509 Heart failure, unspecified: Secondary | ICD-10-CM | POA: Diagnosis not present

## 2012-07-02 DIAGNOSIS — I5031 Acute diastolic (congestive) heart failure: Secondary | ICD-10-CM | POA: Diagnosis not present

## 2012-07-02 LAB — BASIC METABOLIC PANEL
BUN: 55 mg/dL — ABNORMAL HIGH (ref 6–23)
CO2: 31 mEq/L (ref 19–32)
Chloride: 95 mEq/L — ABNORMAL LOW (ref 96–112)
GFR calc non Af Amer: 22 mL/min — ABNORMAL LOW (ref >90)
Glucose, Bld: 79 mg/dL (ref 70–99)
Potassium: 2.5 mEq/L — CL (ref 3.5–5.1)
Sodium: 141 mEq/L (ref 135–145)

## 2012-07-02 LAB — GLUCOSE, CAPILLARY
Glucose-Capillary: 81 mg/dL (ref 70–99)
Glucose-Capillary: 135 mg/dL — ABNORMAL HIGH (ref 70–99)

## 2012-07-02 MED ORDER — POTASSIUM CHLORIDE 10 MEQ/100ML IV SOLN
10.0000 meq | INTRAVENOUS | Status: AC
  Administered 2012-07-02 (×4): 10 meq via INTRAVENOUS
  Filled 2012-07-02 (×4): qty 100

## 2012-07-02 MED ORDER — CLONIDINE HCL 0.2 MG PO TABS
0.2000 mg | ORAL_TABLET | Freq: Three times a day (TID) | ORAL | Status: DC
  Administered 2012-07-02 – 2012-07-03 (×4): 0.2 mg via ORAL
  Filled 2012-07-02 (×6): qty 1

## 2012-07-02 MED ORDER — OXYCODONE HCL 5 MG PO TABS
5.0000 mg | ORAL_TABLET | Freq: Once | ORAL | Status: AC
  Administered 2012-07-02: 5 mg via ORAL
  Filled 2012-07-02: qty 1

## 2012-07-02 MED ORDER — ATORVASTATIN CALCIUM 40 MG PO TABS
40.0000 mg | ORAL_TABLET | Freq: Every evening | ORAL | Status: DC
  Administered 2012-07-02 – 2012-07-03 (×2): 40 mg via ORAL
  Filled 2012-07-02 (×3): qty 1

## 2012-07-02 MED ORDER — INSULIN GLARGINE 100 UNIT/ML ~~LOC~~ SOLN
20.0000 [IU] | Freq: Two times a day (BID) | SUBCUTANEOUS | Status: DC
Start: 2012-07-02 — End: 2012-07-04
  Administered 2012-07-02 – 2012-07-04 (×5): 20 [IU] via SUBCUTANEOUS

## 2012-07-02 MED ORDER — LABETALOL HCL 300 MG PO TABS
300.0000 mg | ORAL_TABLET | Freq: Two times a day (BID) | ORAL | Status: DC
  Administered 2012-07-02 – 2012-07-04 (×4): 300 mg via ORAL
  Filled 2012-07-02 (×5): qty 1

## 2012-07-02 MED ORDER — PANTOPRAZOLE SODIUM 40 MG PO TBEC
40.0000 mg | DELAYED_RELEASE_TABLET | Freq: Every day | ORAL | Status: DC
  Administered 2012-07-02 – 2012-07-04 (×3): 40 mg via ORAL
  Filled 2012-07-02 (×3): qty 1

## 2012-07-02 MED ORDER — DICYCLOMINE HCL 10 MG PO CAPS
10.0000 mg | ORAL_CAPSULE | Freq: Three times a day (TID) | ORAL | Status: DC
  Administered 2012-07-02 – 2012-07-04 (×10): 10 mg via ORAL
  Filled 2012-07-02 (×13): qty 1

## 2012-07-02 MED ORDER — RANOLAZINE ER 500 MG PO TB12
1000.0000 mg | ORAL_TABLET | Freq: Two times a day (BID) | ORAL | Status: DC
  Administered 2012-07-02 – 2012-07-04 (×5): 1000 mg via ORAL
  Filled 2012-07-02 (×6): qty 2

## 2012-07-02 MED ORDER — ASPIRIN EC 325 MG PO TBEC
325.0000 mg | DELAYED_RELEASE_TABLET | Freq: Every day | ORAL | Status: DC
  Administered 2012-07-02 – 2012-07-04 (×3): 325 mg via ORAL
  Filled 2012-07-02 (×3): qty 1

## 2012-07-02 MED ORDER — FEBUXOSTAT 40 MG PO TABS
40.0000 mg | ORAL_TABLET | Freq: Every day | ORAL | Status: DC
  Administered 2012-07-02 – 2012-07-04 (×3): 40 mg via ORAL
  Filled 2012-07-02 (×3): qty 1

## 2012-07-02 MED ORDER — ENOXAPARIN SODIUM 40 MG/0.4ML ~~LOC~~ SOLN
40.0000 mg | SUBCUTANEOUS | Status: DC
  Administered 2012-07-02 – 2012-07-03 (×2): 40 mg via SUBCUTANEOUS
  Filled 2012-07-02 (×3): qty 0.4

## 2012-07-02 MED ORDER — VENLAFAXINE HCL ER 150 MG PO CP24
150.0000 mg | ORAL_CAPSULE | Freq: Every day | ORAL | Status: DC
  Administered 2012-07-02 – 2012-07-03 (×2): 150 mg via ORAL
  Filled 2012-07-02 (×3): qty 1

## 2012-07-02 MED ORDER — LORAZEPAM 0.5 MG PO TABS
0.5000 mg | ORAL_TABLET | Freq: Four times a day (QID) | ORAL | Status: DC | PRN
  Administered 2012-07-02: 0.5 mg via ORAL
  Filled 2012-07-02: qty 1

## 2012-07-02 MED ORDER — LORAZEPAM 2 MG/ML IJ SOLN
0.5000 mg | Freq: Once | INTRAMUSCULAR | Status: AC
  Administered 2012-07-02: 0.5 mg via INTRAVENOUS
  Filled 2012-07-02: qty 1

## 2012-07-02 MED ORDER — HYDRALAZINE HCL 50 MG PO TABS
100.0000 mg | ORAL_TABLET | Freq: Four times a day (QID) | ORAL | Status: DC
  Administered 2012-07-02 – 2012-07-03 (×6): 100 mg via ORAL
  Filled 2012-07-02 (×9): qty 2

## 2012-07-02 MED ORDER — NITROGLYCERIN 0.4 MG SL SUBL: 0.4000 mg | SUBLINGUAL_TABLET | SUBLINGUAL | Status: DC | PRN

## 2012-07-02 MED ORDER — MINOXIDIL 10 MG PO TABS
10.0000 mg | ORAL_TABLET | Freq: Two times a day (BID) | ORAL | Status: DC
  Administered 2012-07-02 – 2012-07-04 (×5): 10 mg via ORAL
  Filled 2012-07-02 (×6): qty 1

## 2012-07-02 MED ORDER — LABETALOL HCL 300 MG PO TABS
300.0000 mg | ORAL_TABLET | Freq: Two times a day (BID) | ORAL | Status: DC
  Filled 2012-07-02 (×2): qty 1

## 2012-07-02 MED ORDER — CALCIUM ACETATE 667 MG PO CAPS
1334.0000 mg | ORAL_CAPSULE | Freq: Three times a day (TID) | ORAL | Status: DC
  Administered 2012-07-02 – 2012-07-04 (×8): 1334 mg via ORAL
  Filled 2012-07-02 (×10): qty 2

## 2012-07-02 NOTE — Progress Notes (Signed)
Pt stat 92% on RA will lying in bed. Put Pt on 2L. Note Pt was sleep apnea and CPAP. Will continue to monitor  

## 2012-07-02 NOTE — Progress Notes (Signed)
PROGRESS NOTE  Carlos Heath HQI:[id number] DOB: [birth year] DOA: 07/01/2012 PCP: [name], MD Cardiologist=Dr. Laurence Slate in [place]  Brief narrative: 46 year old male with history of renal disease and CHF was entered with acute volume overload as well as lower extremity swelling for the past 3-5 days.  Past medical history-As per Problem list Chart reviewed as below-  Admission 2.21.11 for pulm edema  R basilic vein transposition 8.25.11  Consultants:  Nephrology  Procedures:  None currently  Antibiotics:  None   Subjective  Feels better. Less short of breath than prior. Nausea or vomiting currently Tolerate by mouth well. No blurred or double vision currently No chest pain. Tolerating diet.   Objective    Interim History: None  Telemetry: Sinus rhythm  Objective: Filed Vitals:   07/01/12 1937 07/02/12 0551  BP: 161/68 137/63  Pulse: 76 69  Temp: 98 F (36.7 C) 98.2 F (36.8 C)  TempSrc: Axillary Oral  Resp: 18 18  Height: 5\' 8"  (1.727 m)   Weight: 117.028 kg (258 lb) 115.5 kg (254 lb 10.1 oz)  SpO2: 99% 100%    Intake/Output Summary (Last 24 hours) at 07/02/12 0729 Last data filed at 07/02/12 1324  Gross per 24 hour  Intake    720 ml  Output   2400 ml  Net  -1680 ml    Exam:  General: Morbid obesity, Body mass index is 38.72 kg/(m^2). no pallor or icterus. Cardiovascular: S1-S2 no murmur rub or gallop Respiratory: Clinically clear no added sound Abdomen: Soft nontender nondistended Skin no lower extremity edema   Data Reviewed: Basic Metabolic Panel:  Lab 07/02/12 4010 07/01/12 2155  NA 141 141  K 2.5* 3.1*  CL 95* 97  CO2 31 30  GLUCOSE 79 172*  BUN 55* 57*  CREATININE 3.15* 3.26*  CALCIUM 9.4 9.6  MG 1.9 --  PHOS -- --   Liver Function Tests:  Lab 07/01/12 2155  AST 25  ALT 33  ALKPHOS 44  BILITOT 0.3  PROT 6.5  ALBUMIN 4.0   No results found for this basename: LIPASE:5,AMYLASE:5 in the last 168 hours No  results found for this basename: AMMONIA:5 in the last 168 hours CBC:  Lab 07/01/12 2155  WBC 9.0  NEUTROABS --  HGB 11.9*  HCT 35.2*  MCV 85.4  PLT 120*   Cardiac Enzymes:  Lab 07/01/12 2127  CKTOTAL --  CKMB --  CKMBINDEX --  TROPONINI <0.30   BNP: No components found with this basename: POCBNP:5 CBG:  Lab 07/01/12 2117  GLUCAP 191*    Recent Results (from the past 240 hour(s))  MRSA PCR SCREENING     Status: Normal   Collection Time   07/01/12  9:08 PM      Component Value Range Status Comment   MRSA by PCR NEGATIVE  NEGATIVE Final      Studies:              All Imaging reviewed and is as per above notation   Scheduled Meds:   . aspirin  325 mg Oral Daily  . atorvastatin  40 mg Oral QPM  . calcium acetate  1,334 mg Oral TID WC  . cloNIDine  0.2 mg Oral TID  . dicyclomine  10 mg Oral TID AC & HS  . enoxaparin  30 mg Subcutaneous Q24H  . febuxostat  40 mg Oral Daily  . furosemide  80 mg Intravenous Q12H  . hydrALAZINE  100 mg Oral Q6H  . insulin aspart  0-5  Units Subcutaneous QHS  . insulin aspart  0-9 Units Subcutaneous TID WC  . insulin glargine  20 Units Subcutaneous BID  . labetalol  300 mg Oral BID  . minoxidil  10 mg Oral BID  . pantoprazole  40 mg Oral Daily  . potassium chloride  10 mEq Oral BID  . potassium chloride  10 mEq Intravenous Q1 Hr x 4  . ranolazine  1,000 mg Oral BID  . sodium chloride  3 mL Intravenous Q12H  . venlafaxine XR  150 mg Oral QHS   Continuous Infusions:    Assessment/Plan: 1. Acute decompensated CHF-echocardiogram is pending. Continue Lasix 80 mg twice a day, daily weights, strict I.'s and O.'s, low-salt diet,-continue labetalol 300 twice a day. Continue aspirin daily 2.  stage III hypertension/hypertensive urgency-continue clonidine 0.2 mg 3 times a day, hydralazine 100 every 6 hourly, labetalol as above, minoxidil 10 mg twice a day 3.  Hyperlipidemia continue Lipitor 40 mg every afternoon 4. Metabolic bone  disease-continue Hospital 1.334 mg 3 times a day with meals 5. Diabetes mellitus-continue Lantus 20 units subcutaneous with sliding scale insulin sensitive coverage-blood sugars moderately controlled 6. Obstructive sleep apnea-continue CPAP at night 7. ? History CAD in 2000 and-no records. Continue aspirin daily 8. Possible depression history-Cont Effexor 150 qhs, ativan 0.5 q6prn 9. Gout-cont Febuxostat 40 daily  Code Status: Full Family Communication: none in room Disposition Plan: Inpatient   Pleas Koch, MD  [facility] Pager [phone] 07/02/2012, 7:29 AM    LOS: 1 day

## 2012-07-02 NOTE — Progress Notes (Signed)
Pt requested ativan MD page. Pt giving atvian and chaplain call. Chaplain will be by to see Pt. Pt was crying during night shift and having  anxiety about bothers death on December 19, 2022

## 2012-07-02 NOTE — Progress Notes (Addendum)
2045-Pt stated that he did not need help getting CPAP on, RT to monitor and assess as needed.

## 2012-07-02 NOTE — Progress Notes (Signed)
CRITICAL VALUE ALERT  Critical value received: 2.5 - Potassium  Date of notification:  07/02/2012  Time of notification:  0610  Critical value read back:yes  Nurse who received alert: Louretta Parma, RN  MD notified (1st page):  Lenny Pastel PA  Time of first page:  [phone]  MD notified (2nd page):  Time of second page:  Responding MD:  Lenny Pastel PA  Time MD responded:  [phone]

## 2012-07-02 NOTE — Progress Notes (Signed)
Patient called staff into room. Patient noted crying upon arrival to room.  When questioned about emotional state patient stated brother had passed away the day before.  RN asked if currently having suicidal ideation and patient denied.  Callahan notified and Ativan ordered x 1.  RN will continue to monitor. Will notify chaplain in the morning to round on patient for support. Donnamae Jude

## 2012-07-02 NOTE — Progress Notes (Signed)
Pt was placed on CPAP of 8 cmH2O with a medium full face mask with 2 LPM bled in. Pt is comfortable. RT will continue to monitor.

## 2012-07-02 NOTE — Progress Notes (Signed)
  Echocardiogram 2D Echocardiogram has been performed.  Carlos Heath FRANCES 07/02/2012, 4:38 PM

## 2012-07-03 DIAGNOSIS — I5031 Acute diastolic (congestive) heart failure: Secondary | ICD-10-CM | POA: Diagnosis not present

## 2012-07-03 DIAGNOSIS — I509 Heart failure, unspecified: Secondary | ICD-10-CM | POA: Diagnosis not present

## 2012-07-03 LAB — BASIC METABOLIC PANEL
BUN: 56 mg/dL — ABNORMAL HIGH (ref 6–23)
Chloride: 92 mEq/L — ABNORMAL LOW (ref 96–112)
GFR calc Af Amer: 25 mL/min — ABNORMAL LOW (ref >90)
Potassium: 3.3 mEq/L — ABNORMAL LOW (ref 3.5–5.1)
Sodium: 140 mEq/L (ref 135–145)

## 2012-07-03 LAB — GLUCOSE, CAPILLARY
Glucose-Capillary: 88 mg/dL (ref 70–99)
Glucose-Capillary: 91 mg/dL (ref 70–99)
Glucose-Capillary: 137 mg/dL — ABNORMAL HIGH (ref 70–99)

## 2012-07-03 MED ORDER — CLONIDINE HCL 0.2 MG PO TABS
0.2000 mg | ORAL_TABLET | Freq: Two times a day (BID) | ORAL | Status: DC
  Administered 2012-07-03 – 2012-07-04 (×2): 0.2 mg via ORAL
  Filled 2012-07-03 (×3): qty 1

## 2012-07-03 MED ORDER — FUROSEMIDE 20 MG PO TABS
20.0000 mg | ORAL_TABLET | Freq: Two times a day (BID) | ORAL | Status: DC
  Administered 2012-07-03 – 2012-07-04 (×2): 20 mg via ORAL
  Filled 2012-07-03 (×4): qty 1

## 2012-07-03 MED ORDER — OXYCODONE HCL 5 MG PO TABS
5.0000 mg | ORAL_TABLET | Freq: Once | ORAL | Status: AC
  Administered 2012-07-03: 5 mg via ORAL
  Filled 2012-07-03: qty 1

## 2012-07-03 MED ORDER — HYDRALAZINE HCL 50 MG PO TABS
50.0000 mg | ORAL_TABLET | Freq: Four times a day (QID) | ORAL | Status: DC
  Administered 2012-07-03 – 2012-07-04 (×4): 50 mg via ORAL
  Filled 2012-07-03 (×7): qty 1

## 2012-07-03 NOTE — Progress Notes (Signed)
PROGRESS NOTE  Carlos Heath ZOX:[id number] DOB: [birth year] DOA: 07/01/2012 PCP: [name], MD Cardiologist=Dr. Laurence Slate in [place]  Brief narrative: 46 year old male with history of renal disease and CHF was transferred from [facility] with acute volume overload as well as lower extremity swelling for the past 3-5 days on 12/20. He was diuresed with IV Lasix 80 mg twice a day and his blood pressure medications were continued. His blood pressures dropped slightly during hospital stay and he felt orthostatic necessitating an adjustment in medications.  Past medical history-As per Problem list Chart reviewed as below-  Admission 2.21.11 for pulm edema  R basilic vein transposition 8.25.11  Consultants:  Nephrology  Procedures:  None currently  Antibiotics:  None   Subjective  Feels better.  Felt a little dizzy earlier today. No chest pain. Tolerating diet. Still grieving the loss of his brother.   Objective    Interim History: Nursing reports orthostatic were +  Telemetry: Sinus rhythm  Objective: Filed Vitals:   07/03/12 1022 07/03/12 1314 07/03/12 1343 07/03/12 1345  BP: 144/69 109/43 134/60 118/57  Pulse: 88 74 74 72  Temp:  98.5 F (36.9 C) 98.5 F (36.9 C) 98.5 F (36.9 C)  TempSrc:  Oral Oral Oral  Resp:  18 18 18   Height:      Weight:      SpO2:  93% 92% 95%    Intake/Output Summary (Last 24 hours) at 07/03/12 1500 Last data filed at 07/03/12 1314  Gross per 24 hour  Intake    723 ml  Output   5400 ml  Net  -4677 ml    Exam:  General: Morbid obesity, Body mass index is 38.01 kg/(m^2). no pallor or icterus. Cardiovascular: S1-S2 no murmur rub or gallop Respiratory: Clinically clear no added sound Abdomen: Soft nontender nondistended Skin no lower extremity edema   Data Reviewed: Basic Metabolic Panel:  Lab 07/03/12 8119 07/02/12 0503 07/01/12 2155  NA 140 141 141  K 3.3* 2.5* --  CL 92* 95* 97  CO2 31 31 30    GLUCOSE 83 79 172*  BUN 56* 55* 57*  CREATININE 3.22* 3.15* 3.26*  CALCIUM 9.8 9.4 9.6  MG -- 1.9 --  PHOS -- -- --   Liver Function Tests:  Lab 07/01/12 2155  AST 25  ALT 33  ALKPHOS 44  BILITOT 0.3  PROT 6.5  ALBUMIN 4.0   No results found for this basename: LIPASE:5,AMYLASE:5 in the last 168 hours No results found for this basename: AMMONIA:5 in the last 168 hours CBC:  Lab 07/01/12 2155  WBC 9.0  NEUTROABS --  HGB 11.9*  HCT 35.2*  MCV 85.4  PLT 120*   Cardiac Enzymes:  Lab 07/01/12 2127  CKTOTAL --  CKMB --  CKMBINDEX --  TROPONINI <0.30   BNP: No components found with this basename: POCBNP:5 CBG:  Lab 07/03/12 0635 07/02/12 2102 07/02/12 1606 07/02/12 1118 07/02/12 0604  GLUCAP 88 135* 114* 111* 81    Recent Results (from the past 240 hour(s))  MRSA PCR SCREENING     Status: Normal   Collection Time   07/01/12  9:08 PM      Component Value Range Status Comment   MRSA by PCR NEGATIVE  NEGATIVE Final      Studies:              All Imaging reviewed and is as per above notation   Scheduled Meds:    . aspirin  325 mg Oral  Daily  . atorvastatin  40 mg Oral QPM  . calcium acetate  1,334 mg Oral TID WC  . cloNIDine  0.2 mg Oral BID  . dicyclomine  10 mg Oral TID AC & HS  . enoxaparin  40 mg Subcutaneous Q24H  . febuxostat  40 mg Oral Daily  . hydrALAZINE  50 mg Oral Q6H  . insulin aspart  0-5 Units Subcutaneous QHS  . insulin aspart  0-9 Units Subcutaneous TID WC  . insulin glargine  20 Units Subcutaneous BID  . labetalol  300 mg Oral BID  . minoxidil  10 mg Oral BID  . pantoprazole  40 mg Oral Daily  . ranolazine  1,000 mg Oral BID  . sodium chloride  3 mL Intravenous Q12H  . venlafaxine XR  150 mg Oral QHS   Continuous Infusions:    Assessment/Plan: 1. Acute decompensated CHF-echocardiogram is pending. Continue Lasix 80 mg twice a day--> switched to 60 mg by mouth twice a day, daily weights-weight down 4 pounds from admission, -7  liters since admission, low-salt diet,-continue labetalol 300 twice a day. Continue aspirin daily 2.  stage III hypertension/hypertensive urgency-patient had orthostatic hypotension 07/02/2012 therefore clonidine 0.2 mg 3 times a day--> given low blood pressures cut back to twice a day, hydralazine 100 every 6 hourly--> cut back to Q8 hours both on 07/03/2012, labetalol as above, minoxidil 10 mg twice a day 3.  Hyperlipidemia continue Lipitor 40 mg every afternoon 4. Metabolic bone disease-continue Zemplar 1334 mg 3 times a day with meals 5. Diabetes mellitus-continue Lantus 20 units subcutaneous with sliding scale insulin sensitive coverage-blood sugars moderately controlled 6. Obstructive sleep apnea-continue CPAP at night 7. ? History CAD in 2000 and-no records. Continue aspirin daily 8. Possible depression history-Cont Effexor 150 qhs, ativan 0.5 q6prn 9. Gout-cont Febuxostat 40 daily  Code Status: Full Family Communication: none in room Disposition Plan: likely d/c home 1-2 days   Pleas Koch, MD  [facility] Pager [phone] 07/03/2012, 3:00 PM    LOS: 2 days

## 2012-07-03 NOTE — Progress Notes (Signed)
Chaplain visited patient after receiving a referral from his nurse on the phone. Patient had just lost his brother last Monday and appeared to be distressed. Patient shared some family stories with Chaplain and expressed his sad feelings by sobbing as he talked about his deceased brother. Chaplain validated his feelings by encouraging him to cry. Chaplain provided ministry of presence and empathic listening. Chaplain shared words of hope and encouragement with patient. Chaplain also provided grieve counseling to patient and will follow-up as needed. Patient thanked Chaplain for the emotional support and visit.

## 2012-07-03 NOTE — Plan of Care (Signed)
Problem: Phase I Progression Outcomes Goal: EF % per last Echo/documented,Core Reminder form on chart Outcome: Completed/Met Date Met:  07/03/12 EF 60-65 % from 07/02/12

## 2012-07-04 DIAGNOSIS — I509 Heart failure, unspecified: Secondary | ICD-10-CM | POA: Diagnosis not present

## 2012-07-04 DIAGNOSIS — N186 End stage renal disease: Secondary | ICD-10-CM

## 2012-07-04 DIAGNOSIS — E119 Type 2 diabetes mellitus without complications: Secondary | ICD-10-CM | POA: Diagnosis not present

## 2012-07-04 DIAGNOSIS — I5031 Acute diastolic (congestive) heart failure: Secondary | ICD-10-CM | POA: Diagnosis not present

## 2012-07-04 LAB — BASIC METABOLIC PANEL
BUN: 61 mg/dL — ABNORMAL HIGH (ref 6–23)
Calcium: 9.7 mg/dL (ref 8.4–10.5)
Chloride: 89 mEq/L — ABNORMAL LOW (ref 96–112)
Creatinine, Ser: 3.54 mg/dL — ABNORMAL HIGH (ref 0.50–1.35)
GFR calc Af Amer: 22 mL/min — ABNORMAL LOW (ref >90)
GFR calc non Af Amer: 19 mL/min — ABNORMAL LOW (ref >90)

## 2012-07-04 MED ORDER — CLONIDINE HCL 0.1 MG PO TABS: 0.2000 mg | ORAL_TABLET | Freq: Two times a day (BID) | ORAL | Status: DC

## 2012-07-04 MED ORDER — FUROSEMIDE 80 MG PO TABS: 80.0000 mg | ORAL_TABLET | Freq: Three times a day (TID) | ORAL | Status: DC

## 2012-07-04 MED ORDER — HYDRALAZINE HCL 100 MG PO TABS: 100.0000 mg | ORAL_TABLET | Freq: Three times a day (TID) | ORAL | Status: DC

## 2012-07-04 MED ORDER — POTASSIUM CHLORIDE CRYS ER 20 MEQ PO TBCR
40.0000 meq | EXTENDED_RELEASE_TABLET | Freq: Once | ORAL | Status: AC
  Administered 2012-07-04: 40 meq via ORAL
  Filled 2012-07-04: qty 2

## 2012-07-04 NOTE — Progress Notes (Signed)
Chaplain made a follow-up visit to patient. Patient was in the process of being discharged. Patient was high in spirit and look forward going home to be with family for Christmas. Chaplain shared words of joy and hope with patient. Chaplain also provided ministry of presence and wished patient well and good bye. Patient thanked Chaplain for the support and visits throughout his time in the hospital.

## 2012-07-04 NOTE — Discharge Summary (Addendum)
Physician Discharge Summary  DEJAN ANGERT ZOX:[id number] DOB: 02-02-1966 DOA: 07/01/2012  PCP: [name], MD  Admit date: 07/01/2012 Discharge date: 07/04/2012  Time spent: 40 minutes  Recommendations for Outpatient Follow-up:  1. Follow up wit primary nephrologist. 2. Follow up with primary cardiologist. 3. Follow up with PMD.  4. Will need close follow up of electrolytes.   Discharge Diagnoses:  Principal Problem:  *Acute diastolic CHF (congestive heart failure) Active Problems:  Chronic kidney disease (CKD), stage IV (severe)  SOB (shortness of breath)  Edema extremities  Diabetes mellitus  Hypertension  Obesity   Discharge Condition: Satisfactory.   Diet recommendation: Renal: 60-2-2.   Filed Weights   07/03/12 0500 07/03/12 0622 07/04/12 0556  Weight: 113.4 kg (250 lb) 113.4 kg (250 lb) 112.8 kg (248 lb 10.9 oz)    History of present illness:  Carlos Heath is a 46 y.o. male with history of CAD, s/p stents 2011, OSA, HTN, carotid artery disease, depression, chronic anemia, OA, DM2, dyslipidemia, gout and ESRD presenting with worsening SOB, and edema of his lower extremities over the past week. He was told by his cardiologist to go to ED and was transferred from [facility] ED to [facility] since he has ESRD, he is not yet on dialysis and is followed by Dr. Hyman Hopes of [facility]. He was admitted for further management.    Hospital Course:  1. Acute decompensated CHF: Patient presented with progressive shortness of breath, and was found to be significantly volume overloaded. Cardiac enzymes remained un-elevated, 2D echocardiogram showed normal LV cavity size, moderate LVH, EF of 60% to 65% and no regional wall motion abnormalities. There was grade 2 diastolic dysfunction, mild mitral regurgitation, moderately dilated LA, mildly dilated RV, moderately dilated RA. Pulmonic valve: Peak gradient: 22mm Hg (S) and PA peak pressure: 38mm Hg (S). He was managed  with iv Lasix, with dramatic clinical response, and transitioned to oral Lasix on 07/03/12, without deleterious effect. As of 07/04/12, he was asymptomatic. On low dose Aspirin. 2. Stage III hypertension/hypertensive urgency: BP was significantly elevated at 161/68 at presentation. Patient was managed as above, in addition tor pre-admission anti-hypertensives. As he had an episode of orthostatic hypotension on 07/02/2012, Clonidine ha been reduced to 0.2 mg b.i.d, and Hydralazine to 100 Q8 hours. 3. Hyperlipidemia: Continued on Statin. 4. Metabolic bone disease: This is due to ESRD. Continued on Zemplar. 5. Diabetes mellitus: This was controlled on diet, Lantus and SSI , during this hospitalization. CBGs remained reasonable.  6. Obstructive sleep apnea: Stable on nocturnal CPAP.  7. Depression history: Not problematic. Continued on pre-admission antidepressants.  8. Gout: Asymptomatic on Febuxostat. 9. ESRD: Patient has known baseline creatinine of 3.89 in 08/2009. Creatinine remained at baseline during this hospitalization, and was 2.54 on 07/04/12. Have discussed with Dr Elvis Coil today, and he will arrange close outpatient follow up.  10. Hypokalemia: Repleted as indicated.    Procedures:  2D echocardiogram.   Consultations:  N/A.   Telephone discussion with Dr Elvis Coil on 07/04/12.   Discharge Exam: Filed Vitals:   07/04/12 0557 07/04/12 0558 07/04/12 0603 07/04/12 0858  BP: 137/58 123/60 132/65 137/59  Pulse: 76 79 72 80  Temp:    98.8 F (37.1 C)  TempSrc:    Oral  Resp:    20  Height:      Weight:      SpO2:    97%    General: Comfortable, alert, communicative, fully oriented, not short of  breath at rest.  HEENT: Mild clinical pallor, no jaundice, no conjunctival injection or discharge. Hydration status is satisfactory.  NECK: Supple, JVP not seen, no carotid bruits, no palpable lymphadenopathy, no palpable goiter.  CHEST: Clinically clear to auscultation, no  wheezes, no crackles.  HEART: Sounds 1 and 2 heard, normal, regular, no murmurs.  ABDOMEN: Moderately obese, soft, non-tender, no palpable organomegaly, no palpable masses, normal bowel sounds.  GENITALIA: Not examined.  LOWER EXTREMITIES: No pitting edema, palpable peripheral pulses.  MUSCULOSKELETAL SYSTEM: Unremarkable.  CENTRAL NERVOUS SYSTEM: No focal neurologic deficit on gross examination.     Discharge Instructions      Discharge Orders    Future Orders Please Complete By Expires   Diet - low sodium heart healthy      Diet Carb Modified      Increase activity slowly          Medication List     As of 07/04/2012 12:31 PM    TAKE these medications         aspirin 325 MG EC tablet   Take 325 mg by mouth daily.      atorvastatin 40 MG tablet   Commonly known as: LIPITOR   Take 40 mg by mouth every evening.      calcium acetate 667 MG capsule   Commonly known as: PHOSLO   Take 1,334 mg by mouth 3 (three) times daily with meals.      cloNIDine 0.1 MG tablet   Commonly known as: CATAPRES   Take 2 tablets (0.2 mg total) by mouth 2 (two) times daily.      desvenlafaxine 50 MG 24 hr tablet   Commonly known as: PRISTIQ   Take 100 mg by mouth every evening.      dicyclomine 10 MG capsule   Commonly known as: BENTYL   Take 10 mg by mouth 4 (four) times daily -  before meals and at bedtime.      febuxostat 40 MG tablet   Commonly known as: ULORIC   Take 40 mg by mouth daily.      furosemide 80 MG tablet   Commonly known as: LASIX   Take 1 tablet (80 mg total) by mouth 3 (three) times daily.      hydrALAZINE 100 MG tablet   Commonly known as: APRESOLINE   Take 1 tablet (100 mg total) by mouth 3 (three) times daily.      insulin glargine 100 UNIT/ML injection   Commonly known as: LANTUS   Inject 20 Units into the skin 2 (two) times daily as needed. Sliding scale      labetalol 300 MG tablet   Commonly known as: NORMODYNE   Take 300 mg by mouth 2 (two)  times daily.      metolazone 2.5 MG tablet   Commonly known as: ZAROXOLYN   Take 2.5-5 mg by mouth 3 (three) times a week. Mon, Wed, and Fri      minoxidil 10 MG tablet   Commonly known as: LONITEN   Take 10 mg by mouth 2 (two) times daily.      nitroGLYCERIN 0.4 MG SL tablet   Commonly known as: NITROSTAT   Place 0.4 mg under the tongue every 5 (five) minutes as needed. For chest pain      omeprazole 40 MG capsule   Commonly known as: PRILOSEC   Take 40 mg by mouth 2 (two) times daily.      ondansetron 8 MG tablet  Commonly known as: ZOFRAN   Take 8 mg by mouth every 8 (eight) hours as needed. For nausea      oxycodone 5 MG capsule   Commonly known as: OXY-IR   Take 5-10 mg by mouth every 4 (four) hours as needed. For pain      potassium chloride SA 20 MEQ tablet   Commonly known as: K-DUR,KLOR-CON   Take 80 mEq by mouth 2 (two) times daily.      predniSONE 20 MG tablet   Commonly known as: DELTASONE   Take 20 mg by mouth daily as needed. For gout pain      RANEXA 1000 MG SR tablet   Generic drug: ranolazine   Take 1,000 mg by mouth 2 (two) times daily.      zolpidem 10 MG tablet   Commonly known as: AMBIEN   Take 10 mg by mouth at bedtime.         Follow-up Information    Schedule an appointment as soon as possible for a visit with [name], MD. (Follow up with your primary cardiologist. )    Contact information:   [address] [place] [address] [phone]           The results of significant diagnostics from this hospitalization (including imaging, microbiology, ancillary and laboratory) are listed below for reference.    Significant Diagnostic Studies: No results found.  Microbiology: Recent Results (from the past 240 hour(s))  MRSA PCR SCREENING     Status: Normal   Collection Time   07/01/12  9:08 PM      Component Value Range Status Comment   MRSA by PCR NEGATIVE  NEGATIVE Final      Labs: Basic Metabolic Panel:  Lab 07/04/12  0516 07/03/12 0610 07/02/12 0503 07/01/12 2155  NA 135 140 141 141  K 2.8* 3.3* 2.5* 3.1*  CL 89* 92* 95* 97  CO2 33* 31 31 30   GLUCOSE 83 83 79 172*  BUN 61* 56* 55* 57*  CREATININE 3.54* 3.22* 3.15* 3.26*  CALCIUM 9.7 9.8 9.4 9.6  MG -- -- 1.9 --  PHOS -- -- -- --   Liver Function Tests:  Lab 07/01/12 2155  AST 25  ALT 33  ALKPHOS 44  BILITOT 0.3  PROT 6.5  ALBUMIN 4.0   No results found for this basename: LIPASE:5,AMYLASE:5 in the last 168 hours No results found for this basename: AMMONIA:5 in the last 168 hours CBC:  Lab 07/01/12 2155  WBC 9.0  NEUTROABS --  HGB 11.9*  HCT 35.2*  MCV 85.4  PLT 120*   Cardiac Enzymes:  Lab 07/01/12 2127  CKTOTAL --  CKMB --  CKMBINDEX --  TROPONINI <0.30   BNP: BNP (last 3 results) No results found for this basename: PROBNP:3 in the last 8760 hours CBG:  Lab 07/04/12 1140 07/04/12 0554 07/03/12 2052 07/03/12 1608 07/03/12 1107  GLUCAP 112* 87 131* 137* 91       Signed:  Deanglo Hissong,CHRISTOPHER  [facility] 07/04/2012, 12:31 PM

## 2012-07-04 NOTE — Care Management Note (Signed)
    Page 1 of 1   07/04/2012     2:58:56 PM   CARE MANAGEMENT NOTE 07/04/2012  Patient:  Carlos Heath, Carlos Heath   Account Number:  [id number]  Date Initiated:  07/04/2012  Documentation initiated by:  Tera Mater  Subjective/Objective Assessment:   46yo male admitted with SOB.  Pt. lives at home with spouse in [place].     Action/Plan:   In to complete HF Screen.  Pt. states he has a HH RN with [facility].   Anticipated DC Date:  07/04/2012   Anticipated DC Plan:  HOME W HOME HEALTH SERVICES      DC Planning Services  CM consult      [facility] Choice  Resumption Of Svcs/PTA Provider   Choice offered to / List presented to:             [facility] agency  [facility]ALTH   Status of service:  Completed, signed off Medicare Important Message given?   (If response is "NO", the following Medicare IM given date fields will be blank) Date Medicare IM given:   Date Additional Medicare IM given:    Discharge Disposition:  HOME W HOME HEALTH SERVICES  Per UR Regulation:  Reviewed for med. necessity/level of care/duration of stay  If discussed at Long Length of Stay Meetings, dates discussed:    Comments:  07/04/12 1315 TC to [facility] [phone]) to make aware of admission and that pt. would be dc today.  TC to Dr. Brien Few to obtain Resumption of care orders for [facility] RN.  Faxed HH orders for [facility] RN, face to face documentation, facesheet, and discharge summary to ([phone]).  Pt. to be dc home today. Tera Mater, RN, BSN NCM [phone]

## 2012-07-05 NOTE — Progress Notes (Signed)
Utilization Review Completed.   Rylan Kaufmann, RN, BSN Nurse Case Manager  [phone]  

## 2012-07-09 DIAGNOSIS — I509 Heart failure, unspecified: Secondary | ICD-10-CM | POA: Diagnosis not present

## 2012-07-09 DIAGNOSIS — N189 Chronic kidney disease, unspecified: Secondary | ICD-10-CM | POA: Diagnosis not present

## 2012-07-09 DIAGNOSIS — I2510 Atherosclerotic heart disease of native coronary artery without angina pectoris: Secondary | ICD-10-CM | POA: Diagnosis not present

## 2012-07-09 DIAGNOSIS — E119 Type 2 diabetes mellitus without complications: Secondary | ICD-10-CM | POA: Diagnosis not present

## 2012-07-11 DIAGNOSIS — E1165 Type 2 diabetes mellitus with hyperglycemia: Secondary | ICD-10-CM | POA: Diagnosis not present

## 2012-07-11 DIAGNOSIS — I10 Essential (primary) hypertension: Secondary | ICD-10-CM | POA: Diagnosis not present

## 2012-07-11 DIAGNOSIS — I130 Hypertensive heart and chronic kidney disease with heart failure and stage 1 through stage 4 chronic kidney disease, or unspecified chronic kidney disease: Secondary | ICD-10-CM | POA: Diagnosis not present

## 2012-07-11 DIAGNOSIS — E1129 Type 2 diabetes mellitus with other diabetic kidney complication: Secondary | ICD-10-CM | POA: Diagnosis not present

## 2012-07-11 DIAGNOSIS — N039 Chronic nephritic syndrome with unspecified morphologic changes: Secondary | ICD-10-CM | POA: Diagnosis not present

## 2012-07-12 DIAGNOSIS — E119 Type 2 diabetes mellitus without complications: Secondary | ICD-10-CM | POA: Diagnosis not present

## 2012-07-12 DIAGNOSIS — I509 Heart failure, unspecified: Secondary | ICD-10-CM | POA: Diagnosis not present

## 2012-07-12 DIAGNOSIS — N189 Chronic kidney disease, unspecified: Secondary | ICD-10-CM | POA: Diagnosis not present

## 2012-07-12 DIAGNOSIS — I2510 Atherosclerotic heart disease of native coronary artery without angina pectoris: Secondary | ICD-10-CM | POA: Diagnosis not present

## 2012-07-15 DIAGNOSIS — N189 Chronic kidney disease, unspecified: Secondary | ICD-10-CM | POA: Diagnosis not present

## 2012-07-15 DIAGNOSIS — I509 Heart failure, unspecified: Secondary | ICD-10-CM | POA: Diagnosis not present

## 2012-07-15 DIAGNOSIS — E119 Type 2 diabetes mellitus without complications: Secondary | ICD-10-CM | POA: Diagnosis not present

## 2012-07-15 DIAGNOSIS — I2510 Atherosclerotic heart disease of native coronary artery without angina pectoris: Secondary | ICD-10-CM | POA: Diagnosis not present

## 2012-07-18 DIAGNOSIS — E119 Type 2 diabetes mellitus without complications: Secondary | ICD-10-CM | POA: Diagnosis not present

## 2012-07-18 DIAGNOSIS — I509 Heart failure, unspecified: Secondary | ICD-10-CM | POA: Diagnosis not present

## 2012-07-18 DIAGNOSIS — I2510 Atherosclerotic heart disease of native coronary artery without angina pectoris: Secondary | ICD-10-CM | POA: Diagnosis not present

## 2012-07-18 DIAGNOSIS — N189 Chronic kidney disease, unspecified: Secondary | ICD-10-CM | POA: Diagnosis not present

## 2012-07-21 DIAGNOSIS — I509 Heart failure, unspecified: Secondary | ICD-10-CM | POA: Diagnosis not present

## 2012-07-21 DIAGNOSIS — N189 Chronic kidney disease, unspecified: Secondary | ICD-10-CM | POA: Diagnosis not present

## 2012-07-21 DIAGNOSIS — I2510 Atherosclerotic heart disease of native coronary artery without angina pectoris: Secondary | ICD-10-CM | POA: Diagnosis not present

## 2012-07-21 DIAGNOSIS — E119 Type 2 diabetes mellitus without complications: Secondary | ICD-10-CM | POA: Diagnosis not present

## 2012-07-26 DIAGNOSIS — N185 Chronic kidney disease, stage 5: Secondary | ICD-10-CM | POA: Diagnosis not present

## 2012-07-26 DIAGNOSIS — D649 Anemia, unspecified: Secondary | ICD-10-CM | POA: Diagnosis not present

## 2012-07-26 DIAGNOSIS — N2581 Secondary hyperparathyroidism of renal origin: Secondary | ICD-10-CM | POA: Diagnosis not present

## 2012-07-27 DIAGNOSIS — E119 Type 2 diabetes mellitus without complications: Secondary | ICD-10-CM | POA: Diagnosis not present

## 2012-07-27 DIAGNOSIS — N189 Chronic kidney disease, unspecified: Secondary | ICD-10-CM | POA: Diagnosis not present

## 2012-07-27 DIAGNOSIS — I509 Heart failure, unspecified: Secondary | ICD-10-CM | POA: Diagnosis not present

## 2012-07-27 DIAGNOSIS — I2510 Atherosclerotic heart disease of native coronary artery without angina pectoris: Secondary | ICD-10-CM | POA: Diagnosis not present

## 2012-08-03 DIAGNOSIS — N189 Chronic kidney disease, unspecified: Secondary | ICD-10-CM | POA: Diagnosis not present

## 2012-08-03 DIAGNOSIS — I509 Heart failure, unspecified: Secondary | ICD-10-CM | POA: Diagnosis not present

## 2012-08-03 DIAGNOSIS — E119 Type 2 diabetes mellitus without complications: Secondary | ICD-10-CM | POA: Diagnosis not present

## 2012-08-03 DIAGNOSIS — I2510 Atherosclerotic heart disease of native coronary artery without angina pectoris: Secondary | ICD-10-CM | POA: Diagnosis not present

## 2012-08-12 DIAGNOSIS — I509 Heart failure, unspecified: Secondary | ICD-10-CM | POA: Diagnosis not present

## 2012-08-12 DIAGNOSIS — I2510 Atherosclerotic heart disease of native coronary artery without angina pectoris: Secondary | ICD-10-CM | POA: Diagnosis not present

## 2012-08-12 DIAGNOSIS — I10 Essential (primary) hypertension: Secondary | ICD-10-CM | POA: Diagnosis not present

## 2012-08-12 DIAGNOSIS — N189 Chronic kidney disease, unspecified: Secondary | ICD-10-CM | POA: Diagnosis not present

## 2012-08-12 DIAGNOSIS — J069 Acute upper respiratory infection, unspecified: Secondary | ICD-10-CM | POA: Diagnosis not present

## 2012-08-12 DIAGNOSIS — Z79899 Other long term (current) drug therapy: Secondary | ICD-10-CM | POA: Diagnosis not present

## 2012-08-12 DIAGNOSIS — E538 Deficiency of other specified B group vitamins: Secondary | ICD-10-CM | POA: Diagnosis not present

## 2012-08-12 DIAGNOSIS — E119 Type 2 diabetes mellitus without complications: Secondary | ICD-10-CM | POA: Diagnosis not present

## 2012-08-13 DIAGNOSIS — E119 Type 2 diabetes mellitus without complications: Secondary | ICD-10-CM | POA: Diagnosis not present

## 2012-08-13 DIAGNOSIS — N189 Chronic kidney disease, unspecified: Secondary | ICD-10-CM | POA: Diagnosis not present

## 2012-08-13 DIAGNOSIS — I2510 Atherosclerotic heart disease of native coronary artery without angina pectoris: Secondary | ICD-10-CM | POA: Diagnosis not present

## 2012-08-13 DIAGNOSIS — I509 Heart failure, unspecified: Secondary | ICD-10-CM | POA: Diagnosis not present

## 2012-08-19 DIAGNOSIS — E119 Type 2 diabetes mellitus without complications: Secondary | ICD-10-CM | POA: Diagnosis not present

## 2012-08-19 DIAGNOSIS — I2510 Atherosclerotic heart disease of native coronary artery without angina pectoris: Secondary | ICD-10-CM | POA: Diagnosis not present

## 2012-08-19 DIAGNOSIS — N189 Chronic kidney disease, unspecified: Secondary | ICD-10-CM | POA: Diagnosis not present

## 2012-08-19 DIAGNOSIS — I509 Heart failure, unspecified: Secondary | ICD-10-CM | POA: Diagnosis not present

## 2012-08-23 DIAGNOSIS — I509 Heart failure, unspecified: Secondary | ICD-10-CM | POA: Diagnosis not present

## 2012-08-23 DIAGNOSIS — N189 Chronic kidney disease, unspecified: Secondary | ICD-10-CM | POA: Diagnosis not present

## 2012-08-23 DIAGNOSIS — I2510 Atherosclerotic heart disease of native coronary artery without angina pectoris: Secondary | ICD-10-CM | POA: Diagnosis not present

## 2012-08-23 DIAGNOSIS — E119 Type 2 diabetes mellitus without complications: Secondary | ICD-10-CM | POA: Diagnosis not present

## 2012-08-31 DIAGNOSIS — N189 Chronic kidney disease, unspecified: Secondary | ICD-10-CM | POA: Diagnosis not present

## 2012-08-31 DIAGNOSIS — I509 Heart failure, unspecified: Secondary | ICD-10-CM | POA: Diagnosis not present

## 2012-08-31 DIAGNOSIS — E119 Type 2 diabetes mellitus without complications: Secondary | ICD-10-CM | POA: Diagnosis not present

## 2012-08-31 DIAGNOSIS — I2510 Atherosclerotic heart disease of native coronary artery without angina pectoris: Secondary | ICD-10-CM | POA: Diagnosis not present

## 2012-09-02 DIAGNOSIS — E119 Type 2 diabetes mellitus without complications: Secondary | ICD-10-CM | POA: Diagnosis not present

## 2012-09-02 DIAGNOSIS — Z794 Long term (current) use of insulin: Secondary | ICD-10-CM | POA: Diagnosis not present

## 2012-09-02 DIAGNOSIS — K219 Gastro-esophageal reflux disease without esophagitis: Secondary | ICD-10-CM | POA: Diagnosis not present

## 2012-09-02 DIAGNOSIS — I129 Hypertensive chronic kidney disease with stage 1 through stage 4 chronic kidney disease, or unspecified chronic kidney disease: Secondary | ICD-10-CM | POA: Diagnosis not present

## 2012-09-02 DIAGNOSIS — N189 Chronic kidney disease, unspecified: Secondary | ICD-10-CM | POA: Diagnosis not present

## 2012-09-02 DIAGNOSIS — I2510 Atherosclerotic heart disease of native coronary artery without angina pectoris: Secondary | ICD-10-CM | POA: Diagnosis not present

## 2012-09-02 DIAGNOSIS — E7800 Pure hypercholesterolemia, unspecified: Secondary | ICD-10-CM | POA: Diagnosis not present

## 2012-09-02 DIAGNOSIS — E876 Hypokalemia: Secondary | ICD-10-CM | POA: Diagnosis not present

## 2012-09-02 DIAGNOSIS — G8929 Other chronic pain: Secondary | ICD-10-CM | POA: Diagnosis not present

## 2012-09-02 DIAGNOSIS — I509 Heart failure, unspecified: Secondary | ICD-10-CM | POA: Diagnosis not present

## 2012-09-02 DIAGNOSIS — Z79899 Other long term (current) drug therapy: Secondary | ICD-10-CM | POA: Diagnosis not present

## 2012-09-02 DIAGNOSIS — R079 Chest pain, unspecified: Secondary | ICD-10-CM | POA: Diagnosis not present

## 2012-09-02 DIAGNOSIS — R0602 Shortness of breath: Secondary | ICD-10-CM | POA: Diagnosis not present

## 2012-09-06 DIAGNOSIS — N189 Chronic kidney disease, unspecified: Secondary | ICD-10-CM | POA: Diagnosis not present

## 2012-09-06 DIAGNOSIS — I2510 Atherosclerotic heart disease of native coronary artery without angina pectoris: Secondary | ICD-10-CM | POA: Diagnosis not present

## 2012-09-06 DIAGNOSIS — E119 Type 2 diabetes mellitus without complications: Secondary | ICD-10-CM | POA: Diagnosis not present

## 2012-09-06 DIAGNOSIS — I509 Heart failure, unspecified: Secondary | ICD-10-CM | POA: Diagnosis not present

## 2012-09-12 DIAGNOSIS — I509 Heart failure, unspecified: Secondary | ICD-10-CM | POA: Diagnosis not present

## 2012-09-12 DIAGNOSIS — I10 Essential (primary) hypertension: Secondary | ICD-10-CM | POA: Diagnosis not present

## 2012-09-12 DIAGNOSIS — E538 Deficiency of other specified B group vitamins: Secondary | ICD-10-CM | POA: Diagnosis not present

## 2012-09-12 DIAGNOSIS — E119 Type 2 diabetes mellitus without complications: Secondary | ICD-10-CM | POA: Diagnosis not present

## 2012-09-12 DIAGNOSIS — N189 Chronic kidney disease, unspecified: Secondary | ICD-10-CM | POA: Diagnosis not present

## 2012-09-14 DIAGNOSIS — N184 Chronic kidney disease, stage 4 (severe): Secondary | ICD-10-CM | POA: Diagnosis not present

## 2012-09-14 DIAGNOSIS — G4733 Obstructive sleep apnea (adult) (pediatric): Secondary | ICD-10-CM | POA: Diagnosis not present

## 2012-09-14 DIAGNOSIS — E782 Mixed hyperlipidemia: Secondary | ICD-10-CM | POA: Diagnosis not present

## 2012-09-14 DIAGNOSIS — E1129 Type 2 diabetes mellitus with other diabetic kidney complication: Secondary | ICD-10-CM | POA: Diagnosis not present

## 2012-09-14 DIAGNOSIS — E1165 Type 2 diabetes mellitus with hyperglycemia: Secondary | ICD-10-CM | POA: Diagnosis not present

## 2012-09-14 DIAGNOSIS — I252 Old myocardial infarction: Secondary | ICD-10-CM | POA: Diagnosis not present

## 2012-09-14 DIAGNOSIS — I2510 Atherosclerotic heart disease of native coronary artery without angina pectoris: Secondary | ICD-10-CM | POA: Diagnosis not present

## 2012-09-14 DIAGNOSIS — I1310 Hypertensive heart and chronic kidney disease without heart failure, with stage 1 through stage 4 chronic kidney disease, or unspecified chronic kidney disease: Secondary | ICD-10-CM | POA: Diagnosis not present

## 2012-09-14 DIAGNOSIS — N189 Chronic kidney disease, unspecified: Secondary | ICD-10-CM | POA: Diagnosis not present

## 2012-09-14 DIAGNOSIS — E785 Hyperlipidemia, unspecified: Secondary | ICD-10-CM | POA: Diagnosis not present

## 2012-09-14 DIAGNOSIS — I10 Essential (primary) hypertension: Secondary | ICD-10-CM | POA: Diagnosis not present

## 2012-09-16 DIAGNOSIS — E119 Type 2 diabetes mellitus without complications: Secondary | ICD-10-CM | POA: Diagnosis not present

## 2012-09-16 DIAGNOSIS — I509 Heart failure, unspecified: Secondary | ICD-10-CM | POA: Diagnosis not present

## 2012-09-16 DIAGNOSIS — I2510 Atherosclerotic heart disease of native coronary artery without angina pectoris: Secondary | ICD-10-CM | POA: Diagnosis not present

## 2012-09-16 DIAGNOSIS — N189 Chronic kidney disease, unspecified: Secondary | ICD-10-CM | POA: Diagnosis not present

## 2012-09-21 DIAGNOSIS — N189 Chronic kidney disease, unspecified: Secondary | ICD-10-CM | POA: Diagnosis not present

## 2012-09-28 DIAGNOSIS — I509 Heart failure, unspecified: Secondary | ICD-10-CM | POA: Diagnosis not present

## 2012-09-28 DIAGNOSIS — N189 Chronic kidney disease, unspecified: Secondary | ICD-10-CM | POA: Diagnosis not present

## 2012-09-28 DIAGNOSIS — I2510 Atherosclerotic heart disease of native coronary artery without angina pectoris: Secondary | ICD-10-CM | POA: Diagnosis not present

## 2012-09-28 DIAGNOSIS — E119 Type 2 diabetes mellitus without complications: Secondary | ICD-10-CM | POA: Diagnosis not present

## 2012-10-03 DIAGNOSIS — N184 Chronic kidney disease, stage 4 (severe): Secondary | ICD-10-CM | POA: Diagnosis not present

## 2012-10-03 DIAGNOSIS — D638 Anemia in other chronic diseases classified elsewhere: Secondary | ICD-10-CM | POA: Diagnosis not present

## 2012-10-04 DIAGNOSIS — N184 Chronic kidney disease, stage 4 (severe): Secondary | ICD-10-CM | POA: Diagnosis not present

## 2012-10-04 DIAGNOSIS — D649 Anemia, unspecified: Secondary | ICD-10-CM | POA: Diagnosis not present

## 2012-10-04 DIAGNOSIS — N2581 Secondary hyperparathyroidism of renal origin: Secondary | ICD-10-CM | POA: Diagnosis not present

## 2012-10-10 DIAGNOSIS — N189 Chronic kidney disease, unspecified: Secondary | ICD-10-CM | POA: Diagnosis not present

## 2012-10-10 DIAGNOSIS — I2510 Atherosclerotic heart disease of native coronary artery without angina pectoris: Secondary | ICD-10-CM | POA: Diagnosis not present

## 2012-10-10 DIAGNOSIS — I509 Heart failure, unspecified: Secondary | ICD-10-CM | POA: Diagnosis not present

## 2012-10-10 DIAGNOSIS — E119 Type 2 diabetes mellitus without complications: Secondary | ICD-10-CM | POA: Diagnosis not present

## 2012-10-17 DIAGNOSIS — E538 Deficiency of other specified B group vitamins: Secondary | ICD-10-CM | POA: Diagnosis not present

## 2012-10-17 DIAGNOSIS — N184 Chronic kidney disease, stage 4 (severe): Secondary | ICD-10-CM | POA: Diagnosis not present

## 2012-10-17 DIAGNOSIS — D638 Anemia in other chronic diseases classified elsewhere: Secondary | ICD-10-CM | POA: Diagnosis not present

## 2012-11-16 DIAGNOSIS — D638 Anemia in other chronic diseases classified elsewhere: Secondary | ICD-10-CM | POA: Diagnosis not present

## 2012-11-16 DIAGNOSIS — N184 Chronic kidney disease, stage 4 (severe): Secondary | ICD-10-CM | POA: Diagnosis not present

## 2012-11-23 DIAGNOSIS — D638 Anemia in other chronic diseases classified elsewhere: Secondary | ICD-10-CM | POA: Diagnosis not present

## 2012-11-23 DIAGNOSIS — N184 Chronic kidney disease, stage 4 (severe): Secondary | ICD-10-CM | POA: Diagnosis not present

## 2012-11-30 DIAGNOSIS — N189 Chronic kidney disease, unspecified: Secondary | ICD-10-CM | POA: Diagnosis not present

## 2012-11-30 DIAGNOSIS — I2510 Atherosclerotic heart disease of native coronary artery without angina pectoris: Secondary | ICD-10-CM | POA: Diagnosis not present

## 2012-11-30 DIAGNOSIS — N433 Hydrocele, unspecified: Secondary | ICD-10-CM | POA: Diagnosis not present

## 2012-11-30 DIAGNOSIS — I509 Heart failure, unspecified: Secondary | ICD-10-CM | POA: Diagnosis not present

## 2012-11-30 DIAGNOSIS — K219 Gastro-esophageal reflux disease without esophagitis: Secondary | ICD-10-CM | POA: Diagnosis not present

## 2012-11-30 DIAGNOSIS — E7800 Pure hypercholesterolemia, unspecified: Secondary | ICD-10-CM | POA: Diagnosis not present

## 2012-11-30 DIAGNOSIS — R5381 Other malaise: Secondary | ICD-10-CM | POA: Diagnosis not present

## 2012-11-30 DIAGNOSIS — E119 Type 2 diabetes mellitus without complications: Secondary | ICD-10-CM | POA: Diagnosis not present

## 2012-11-30 DIAGNOSIS — I129 Hypertensive chronic kidney disease with stage 1 through stage 4 chronic kidney disease, or unspecified chronic kidney disease: Secondary | ICD-10-CM | POA: Diagnosis not present

## 2012-12-07 DIAGNOSIS — N184 Chronic kidney disease, stage 4 (severe): Secondary | ICD-10-CM | POA: Diagnosis not present

## 2012-12-07 DIAGNOSIS — D638 Anemia in other chronic diseases classified elsewhere: Secondary | ICD-10-CM | POA: Diagnosis not present

## 2012-12-12 DIAGNOSIS — N184 Chronic kidney disease, stage 4 (severe): Secondary | ICD-10-CM | POA: Diagnosis not present

## 2012-12-12 DIAGNOSIS — D638 Anemia in other chronic diseases classified elsewhere: Secondary | ICD-10-CM | POA: Diagnosis not present

## 2012-12-17 DIAGNOSIS — M109 Gout, unspecified: Secondary | ICD-10-CM | POA: Diagnosis not present

## 2012-12-17 DIAGNOSIS — M7989 Other specified soft tissue disorders: Secondary | ICD-10-CM | POA: Diagnosis not present

## 2013-01-16 DIAGNOSIS — E119 Type 2 diabetes mellitus without complications: Secondary | ICD-10-CM | POA: Diagnosis present

## 2013-01-16 DIAGNOSIS — I359 Nonrheumatic aortic valve disorder, unspecified: Secondary | ICD-10-CM | POA: Diagnosis not present

## 2013-01-16 DIAGNOSIS — M109 Gout, unspecified: Secondary | ICD-10-CM | POA: Diagnosis present

## 2013-01-16 DIAGNOSIS — R0789 Other chest pain: Secondary | ICD-10-CM | POA: Diagnosis not present

## 2013-01-16 DIAGNOSIS — I447 Left bundle-branch block, unspecified: Secondary | ICD-10-CM | POA: Diagnosis not present

## 2013-01-16 DIAGNOSIS — I4460 Unspecified fascicular block: Secondary | ICD-10-CM | POA: Diagnosis not present

## 2013-01-16 DIAGNOSIS — G4733 Obstructive sleep apnea (adult) (pediatric): Secondary | ICD-10-CM | POA: Diagnosis present

## 2013-01-16 DIAGNOSIS — Z9861 Coronary angioplasty status: Secondary | ICD-10-CM | POA: Diagnosis not present

## 2013-01-16 DIAGNOSIS — I2510 Atherosclerotic heart disease of native coronary artery without angina pectoris: Secondary | ICD-10-CM | POA: Diagnosis not present

## 2013-01-16 DIAGNOSIS — I495 Sick sinus syndrome: Secondary | ICD-10-CM | POA: Diagnosis not present

## 2013-01-16 DIAGNOSIS — I369 Nonrheumatic tricuspid valve disorder, unspecified: Secondary | ICD-10-CM | POA: Diagnosis not present

## 2013-01-16 DIAGNOSIS — E785 Hyperlipidemia, unspecified: Secondary | ICD-10-CM | POA: Diagnosis present

## 2013-01-16 DIAGNOSIS — E876 Hypokalemia: Secondary | ICD-10-CM | POA: Diagnosis not present

## 2013-01-16 DIAGNOSIS — R079 Chest pain, unspecified: Secondary | ICD-10-CM | POA: Diagnosis not present

## 2013-01-16 DIAGNOSIS — R55 Syncope and collapse: Secondary | ICD-10-CM | POA: Diagnosis not present

## 2013-01-16 DIAGNOSIS — F17200 Nicotine dependence, unspecified, uncomplicated: Secondary | ICD-10-CM | POA: Diagnosis present

## 2013-01-16 DIAGNOSIS — I129 Hypertensive chronic kidney disease with stage 1 through stage 4 chronic kidney disease, or unspecified chronic kidney disease: Secondary | ICD-10-CM | POA: Diagnosis not present

## 2013-01-16 DIAGNOSIS — I2789 Other specified pulmonary heart diseases: Secondary | ICD-10-CM | POA: Diagnosis not present

## 2013-01-16 DIAGNOSIS — I200 Unstable angina: Secondary | ICD-10-CM | POA: Diagnosis not present

## 2013-01-16 DIAGNOSIS — N184 Chronic kidney disease, stage 4 (severe): Secondary | ICD-10-CM | POA: Diagnosis not present

## 2013-01-16 DIAGNOSIS — K219 Gastro-esophageal reflux disease without esophagitis: Secondary | ICD-10-CM | POA: Diagnosis present

## 2013-01-23 DIAGNOSIS — D638 Anemia in other chronic diseases classified elsewhere: Secondary | ICD-10-CM | POA: Diagnosis not present

## 2013-01-23 DIAGNOSIS — N184 Chronic kidney disease, stage 4 (severe): Secondary | ICD-10-CM | POA: Diagnosis not present

## 2013-01-31 DIAGNOSIS — N189 Chronic kidney disease, unspecified: Secondary | ICD-10-CM | POA: Diagnosis not present

## 2013-01-31 DIAGNOSIS — I10 Essential (primary) hypertension: Secondary | ICD-10-CM | POA: Diagnosis not present

## 2013-01-31 DIAGNOSIS — I2789 Other specified pulmonary heart diseases: Secondary | ICD-10-CM | POA: Diagnosis not present

## 2013-01-31 DIAGNOSIS — G4733 Obstructive sleep apnea (adult) (pediatric): Secondary | ICD-10-CM | POA: Diagnosis not present

## 2013-01-31 DIAGNOSIS — E785 Hyperlipidemia, unspecified: Secondary | ICD-10-CM | POA: Diagnosis not present

## 2013-02-03 DIAGNOSIS — N184 Chronic kidney disease, stage 4 (severe): Secondary | ICD-10-CM | POA: Diagnosis not present

## 2013-02-03 DIAGNOSIS — D638 Anemia in other chronic diseases classified elsewhere: Secondary | ICD-10-CM | POA: Diagnosis not present

## 2013-02-17 DIAGNOSIS — I129 Hypertensive chronic kidney disease with stage 1 through stage 4 chronic kidney disease, or unspecified chronic kidney disease: Secondary | ICD-10-CM | POA: Diagnosis not present

## 2013-02-17 DIAGNOSIS — N184 Chronic kidney disease, stage 4 (severe): Secondary | ICD-10-CM | POA: Diagnosis not present

## 2013-02-17 DIAGNOSIS — D638 Anemia in other chronic diseases classified elsewhere: Secondary | ICD-10-CM | POA: Diagnosis not present

## 2013-02-17 DIAGNOSIS — K219 Gastro-esophageal reflux disease without esophagitis: Secondary | ICD-10-CM | POA: Diagnosis not present

## 2013-02-17 DIAGNOSIS — I2510 Atherosclerotic heart disease of native coronary artery without angina pectoris: Secondary | ICD-10-CM | POA: Diagnosis not present

## 2013-02-17 DIAGNOSIS — R109 Unspecified abdominal pain: Secondary | ICD-10-CM | POA: Diagnosis not present

## 2013-02-17 DIAGNOSIS — R110 Nausea: Secondary | ICD-10-CM | POA: Diagnosis not present

## 2013-02-17 DIAGNOSIS — E7800 Pure hypercholesterolemia, unspecified: Secondary | ICD-10-CM | POA: Diagnosis not present

## 2013-02-17 DIAGNOSIS — M25579 Pain in unspecified ankle and joints of unspecified foot: Secondary | ICD-10-CM | POA: Diagnosis not present

## 2013-02-17 DIAGNOSIS — M109 Gout, unspecified: Secondary | ICD-10-CM | POA: Diagnosis not present

## 2013-02-17 DIAGNOSIS — I509 Heart failure, unspecified: Secondary | ICD-10-CM | POA: Diagnosis not present

## 2013-02-17 DIAGNOSIS — R1011 Right upper quadrant pain: Secondary | ICD-10-CM | POA: Diagnosis not present

## 2013-02-18 DIAGNOSIS — M25579 Pain in unspecified ankle and joints of unspecified foot: Secondary | ICD-10-CM | POA: Diagnosis not present

## 2013-02-18 DIAGNOSIS — M109 Gout, unspecified: Secondary | ICD-10-CM | POA: Diagnosis not present

## 2013-03-03 DIAGNOSIS — I2510 Atherosclerotic heart disease of native coronary artery without angina pectoris: Secondary | ICD-10-CM | POA: Diagnosis not present

## 2013-03-03 DIAGNOSIS — E7800 Pure hypercholesterolemia, unspecified: Secondary | ICD-10-CM | POA: Diagnosis not present

## 2013-03-03 DIAGNOSIS — R110 Nausea: Secondary | ICD-10-CM | POA: Diagnosis not present

## 2013-03-03 DIAGNOSIS — I509 Heart failure, unspecified: Secondary | ICD-10-CM | POA: Diagnosis not present

## 2013-03-03 DIAGNOSIS — I129 Hypertensive chronic kidney disease with stage 1 through stage 4 chronic kidney disease, or unspecified chronic kidney disease: Secondary | ICD-10-CM | POA: Diagnosis not present

## 2013-03-03 DIAGNOSIS — R1011 Right upper quadrant pain: Secondary | ICD-10-CM | POA: Diagnosis not present

## 2013-03-03 DIAGNOSIS — N184 Chronic kidney disease, stage 4 (severe): Secondary | ICD-10-CM | POA: Diagnosis not present

## 2013-03-03 DIAGNOSIS — K219 Gastro-esophageal reflux disease without esophagitis: Secondary | ICD-10-CM | POA: Diagnosis not present

## 2013-03-07 DIAGNOSIS — D649 Anemia, unspecified: Secondary | ICD-10-CM | POA: Diagnosis not present

## 2013-03-07 DIAGNOSIS — N2581 Secondary hyperparathyroidism of renal origin: Secondary | ICD-10-CM | POA: Diagnosis not present

## 2013-03-07 DIAGNOSIS — N185 Chronic kidney disease, stage 5: Secondary | ICD-10-CM | POA: Diagnosis not present

## 2013-03-17 DIAGNOSIS — D638 Anemia in other chronic diseases classified elsewhere: Secondary | ICD-10-CM | POA: Diagnosis not present

## 2013-03-17 DIAGNOSIS — N184 Chronic kidney disease, stage 4 (severe): Secondary | ICD-10-CM | POA: Diagnosis not present

## 2013-03-24 DIAGNOSIS — G4700 Insomnia, unspecified: Secondary | ICD-10-CM | POA: Diagnosis not present

## 2013-03-24 DIAGNOSIS — N184 Chronic kidney disease, stage 4 (severe): Secondary | ICD-10-CM | POA: Diagnosis not present

## 2013-03-24 DIAGNOSIS — I1310 Hypertensive heart and chronic kidney disease without heart failure, with stage 1 through stage 4 chronic kidney disease, or unspecified chronic kidney disease: Secondary | ICD-10-CM | POA: Diagnosis not present

## 2013-03-24 DIAGNOSIS — D638 Anemia in other chronic diseases classified elsewhere: Secondary | ICD-10-CM | POA: Diagnosis not present

## 2013-03-24 DIAGNOSIS — I10 Essential (primary) hypertension: Secondary | ICD-10-CM | POA: Diagnosis not present

## 2013-03-24 DIAGNOSIS — E1129 Type 2 diabetes mellitus with other diabetic kidney complication: Secondary | ICD-10-CM | POA: Diagnosis not present

## 2013-03-24 DIAGNOSIS — R1011 Right upper quadrant pain: Secondary | ICD-10-CM | POA: Diagnosis not present

## 2013-03-24 DIAGNOSIS — E782 Mixed hyperlipidemia: Secondary | ICD-10-CM | POA: Diagnosis not present

## 2013-03-24 DIAGNOSIS — Z23 Encounter for immunization: Secondary | ICD-10-CM | POA: Diagnosis not present

## 2013-04-03 DIAGNOSIS — R1011 Right upper quadrant pain: Secondary | ICD-10-CM | POA: Diagnosis not present

## 2013-04-03 DIAGNOSIS — D631 Anemia in chronic kidney disease: Secondary | ICD-10-CM | POA: Diagnosis not present

## 2013-04-03 DIAGNOSIS — N184 Chronic kidney disease, stage 4 (severe): Secondary | ICD-10-CM | POA: Diagnosis not present

## 2013-04-10 DIAGNOSIS — N184 Chronic kidney disease, stage 4 (severe): Secondary | ICD-10-CM | POA: Diagnosis not present

## 2013-04-10 DIAGNOSIS — D638 Anemia in other chronic diseases classified elsewhere: Secondary | ICD-10-CM | POA: Diagnosis not present

## 2013-04-21 DIAGNOSIS — N184 Chronic kidney disease, stage 4 (severe): Secondary | ICD-10-CM | POA: Diagnosis not present

## 2013-04-21 DIAGNOSIS — D638 Anemia in other chronic diseases classified elsewhere: Secondary | ICD-10-CM | POA: Diagnosis not present

## 2013-04-28 DIAGNOSIS — D631 Anemia in chronic kidney disease: Secondary | ICD-10-CM | POA: Diagnosis not present

## 2013-04-28 DIAGNOSIS — N184 Chronic kidney disease, stage 4 (severe): Secondary | ICD-10-CM | POA: Diagnosis not present

## 2013-05-11 DIAGNOSIS — I2789 Other specified pulmonary heart diseases: Secondary | ICD-10-CM | POA: Diagnosis not present

## 2013-05-11 DIAGNOSIS — D638 Anemia in other chronic diseases classified elsewhere: Secondary | ICD-10-CM | POA: Diagnosis not present

## 2013-05-11 DIAGNOSIS — R0989 Other specified symptoms and signs involving the circulatory and respiratory systems: Secondary | ICD-10-CM | POA: Diagnosis not present

## 2013-05-11 DIAGNOSIS — N184 Chronic kidney disease, stage 4 (severe): Secondary | ICD-10-CM | POA: Diagnosis not present

## 2013-05-11 DIAGNOSIS — E785 Hyperlipidemia, unspecified: Secondary | ICD-10-CM | POA: Diagnosis not present

## 2013-05-11 DIAGNOSIS — I2510 Atherosclerotic heart disease of native coronary artery without angina pectoris: Secondary | ICD-10-CM | POA: Diagnosis not present

## 2013-05-11 DIAGNOSIS — N189 Chronic kidney disease, unspecified: Secondary | ICD-10-CM | POA: Diagnosis not present

## 2013-05-11 DIAGNOSIS — I10 Essential (primary) hypertension: Secondary | ICD-10-CM | POA: Diagnosis not present

## 2013-05-11 DIAGNOSIS — G4733 Obstructive sleep apnea (adult) (pediatric): Secondary | ICD-10-CM | POA: Diagnosis not present

## 2013-05-18 DIAGNOSIS — D649 Anemia, unspecified: Secondary | ICD-10-CM | POA: Diagnosis not present

## 2013-05-18 DIAGNOSIS — I509 Heart failure, unspecified: Secondary | ICD-10-CM | POA: Diagnosis not present

## 2013-05-18 DIAGNOSIS — I2510 Atherosclerotic heart disease of native coronary artery without angina pectoris: Secondary | ICD-10-CM | POA: Diagnosis not present

## 2013-05-18 DIAGNOSIS — E119 Type 2 diabetes mellitus without complications: Secondary | ICD-10-CM | POA: Diagnosis not present

## 2013-05-18 DIAGNOSIS — E7800 Pure hypercholesterolemia, unspecified: Secondary | ICD-10-CM | POA: Diagnosis not present

## 2013-05-18 DIAGNOSIS — E860 Dehydration: Secondary | ICD-10-CM | POA: Diagnosis not present

## 2013-05-18 DIAGNOSIS — K219 Gastro-esophageal reflux disease without esophagitis: Secondary | ICD-10-CM | POA: Diagnosis not present

## 2013-05-18 DIAGNOSIS — I4460 Unspecified fascicular block: Secondary | ICD-10-CM | POA: Diagnosis not present

## 2013-05-18 DIAGNOSIS — F17200 Nicotine dependence, unspecified, uncomplicated: Secondary | ICD-10-CM | POA: Diagnosis not present

## 2013-05-18 DIAGNOSIS — N184 Chronic kidney disease, stage 4 (severe): Secondary | ICD-10-CM | POA: Diagnosis not present

## 2013-05-18 DIAGNOSIS — Z79899 Other long term (current) drug therapy: Secondary | ICD-10-CM | POA: Diagnosis not present

## 2013-05-18 DIAGNOSIS — K5289 Other specified noninfective gastroenteritis and colitis: Secondary | ICD-10-CM | POA: Diagnosis not present

## 2013-05-18 DIAGNOSIS — I129 Hypertensive chronic kidney disease with stage 1 through stage 4 chronic kidney disease, or unspecified chronic kidney disease: Secondary | ICD-10-CM | POA: Diagnosis not present

## 2013-05-18 DIAGNOSIS — R112 Nausea with vomiting, unspecified: Secondary | ICD-10-CM | POA: Diagnosis not present

## 2013-05-26 DIAGNOSIS — D638 Anemia in other chronic diseases classified elsewhere: Secondary | ICD-10-CM | POA: Diagnosis not present

## 2013-05-26 DIAGNOSIS — N184 Chronic kidney disease, stage 4 (severe): Secondary | ICD-10-CM | POA: Diagnosis not present

## 2013-05-26 DIAGNOSIS — E785 Hyperlipidemia, unspecified: Secondary | ICD-10-CM | POA: Diagnosis not present

## 2013-06-02 DIAGNOSIS — N184 Chronic kidney disease, stage 4 (severe): Secondary | ICD-10-CM | POA: Diagnosis not present

## 2013-06-02 DIAGNOSIS — D631 Anemia in chronic kidney disease: Secondary | ICD-10-CM | POA: Diagnosis not present

## 2013-06-05 DIAGNOSIS — R809 Proteinuria, unspecified: Secondary | ICD-10-CM | POA: Diagnosis not present

## 2013-06-05 DIAGNOSIS — N184 Chronic kidney disease, stage 4 (severe): Secondary | ICD-10-CM | POA: Diagnosis not present

## 2013-06-05 DIAGNOSIS — E1129 Type 2 diabetes mellitus with other diabetic kidney complication: Secondary | ICD-10-CM | POA: Diagnosis not present

## 2013-06-05 DIAGNOSIS — N2581 Secondary hyperparathyroidism of renal origin: Secondary | ICD-10-CM | POA: Diagnosis not present

## 2013-06-12 DIAGNOSIS — N184 Chronic kidney disease, stage 4 (severe): Secondary | ICD-10-CM | POA: Diagnosis not present

## 2013-06-12 DIAGNOSIS — D638 Anemia in other chronic diseases classified elsewhere: Secondary | ICD-10-CM | POA: Diagnosis not present

## 2013-06-19 DIAGNOSIS — N184 Chronic kidney disease, stage 4 (severe): Secondary | ICD-10-CM | POA: Diagnosis not present

## 2013-06-19 DIAGNOSIS — D638 Anemia in other chronic diseases classified elsewhere: Secondary | ICD-10-CM | POA: Diagnosis not present

## 2013-07-10 DIAGNOSIS — N184 Chronic kidney disease, stage 4 (severe): Secondary | ICD-10-CM | POA: Diagnosis not present

## 2013-07-10 DIAGNOSIS — D638 Anemia in other chronic diseases classified elsewhere: Secondary | ICD-10-CM | POA: Diagnosis not present

## 2013-08-10 DIAGNOSIS — N184 Chronic kidney disease, stage 4 (severe): Secondary | ICD-10-CM | POA: Diagnosis not present

## 2013-08-10 DIAGNOSIS — D638 Anemia in other chronic diseases classified elsewhere: Secondary | ICD-10-CM | POA: Diagnosis not present

## 2013-08-21 DIAGNOSIS — N184 Chronic kidney disease, stage 4 (severe): Secondary | ICD-10-CM | POA: Diagnosis not present

## 2013-08-21 DIAGNOSIS — E1129 Type 2 diabetes mellitus with other diabetic kidney complication: Secondary | ICD-10-CM | POA: Diagnosis not present

## 2013-08-25 DIAGNOSIS — N184 Chronic kidney disease, stage 4 (severe): Secondary | ICD-10-CM | POA: Diagnosis not present

## 2013-08-25 DIAGNOSIS — D638 Anemia in other chronic diseases classified elsewhere: Secondary | ICD-10-CM | POA: Diagnosis not present

## 2013-09-13 DIAGNOSIS — N183 Chronic kidney disease, stage 3 unspecified: Secondary | ICD-10-CM | POA: Diagnosis not present

## 2013-09-13 DIAGNOSIS — D631 Anemia in chronic kidney disease: Secondary | ICD-10-CM | POA: Diagnosis not present

## 2013-10-12 DIAGNOSIS — I10 Essential (primary) hypertension: Secondary | ICD-10-CM | POA: Diagnosis not present

## 2013-10-12 DIAGNOSIS — E785 Hyperlipidemia, unspecified: Secondary | ICD-10-CM | POA: Diagnosis not present

## 2013-10-12 DIAGNOSIS — I2789 Other specified pulmonary heart diseases: Secondary | ICD-10-CM | POA: Diagnosis not present

## 2013-10-12 DIAGNOSIS — N189 Chronic kidney disease, unspecified: Secondary | ICD-10-CM | POA: Diagnosis not present

## 2013-10-12 DIAGNOSIS — G4733 Obstructive sleep apnea (adult) (pediatric): Secondary | ICD-10-CM | POA: Diagnosis not present

## 2013-10-17 DIAGNOSIS — E1129 Type 2 diabetes mellitus with other diabetic kidney complication: Secondary | ICD-10-CM | POA: Diagnosis not present

## 2013-10-17 DIAGNOSIS — I1310 Hypertensive heart and chronic kidney disease without heart failure, with stage 1 through stage 4 chronic kidney disease, or unspecified chronic kidney disease: Secondary | ICD-10-CM | POA: Diagnosis not present

## 2013-10-17 DIAGNOSIS — K219 Gastro-esophageal reflux disease without esophagitis: Secondary | ICD-10-CM | POA: Diagnosis not present

## 2013-10-17 DIAGNOSIS — F331 Major depressive disorder, recurrent, moderate: Secondary | ICD-10-CM | POA: Diagnosis not present

## 2013-10-17 DIAGNOSIS — N184 Chronic kidney disease, stage 4 (severe): Secondary | ICD-10-CM | POA: Diagnosis not present

## 2013-10-17 DIAGNOSIS — E782 Mixed hyperlipidemia: Secondary | ICD-10-CM | POA: Diagnosis not present

## 2013-10-17 DIAGNOSIS — I10 Essential (primary) hypertension: Secondary | ICD-10-CM | POA: Diagnosis not present

## 2013-10-23 DIAGNOSIS — D631 Anemia in chronic kidney disease: Secondary | ICD-10-CM | POA: Diagnosis not present

## 2013-10-23 DIAGNOSIS — N185 Chronic kidney disease, stage 5: Secondary | ICD-10-CM | POA: Diagnosis not present

## 2013-10-30 DIAGNOSIS — I2510 Atherosclerotic heart disease of native coronary artery without angina pectoris: Secondary | ICD-10-CM | POA: Diagnosis not present

## 2013-10-30 DIAGNOSIS — I252 Old myocardial infarction: Secondary | ICD-10-CM | POA: Diagnosis not present

## 2013-10-31 DIAGNOSIS — M7989 Other specified soft tissue disorders: Secondary | ICD-10-CM | POA: Diagnosis not present

## 2013-10-31 DIAGNOSIS — M79609 Pain in unspecified limb: Secondary | ICD-10-CM | POA: Diagnosis not present

## 2013-11-02 ENCOUNTER — Telehealth: Payer: Self-pay

## 2013-11-02 DIAGNOSIS — R0789 Other chest pain: Secondary | ICD-10-CM | POA: Diagnosis not present

## 2013-11-02 DIAGNOSIS — N189 Chronic kidney disease, unspecified: Secondary | ICD-10-CM | POA: Diagnosis not present

## 2013-11-02 DIAGNOSIS — R072 Precordial pain: Secondary | ICD-10-CM | POA: Diagnosis not present

## 2013-11-02 DIAGNOSIS — R0989 Other specified symptoms and signs involving the circulatory and respiratory systems: Secondary | ICD-10-CM | POA: Diagnosis not present

## 2013-11-02 DIAGNOSIS — R0602 Shortness of breath: Secondary | ICD-10-CM | POA: Diagnosis not present

## 2013-11-02 DIAGNOSIS — I129 Hypertensive chronic kidney disease with stage 1 through stage 4 chronic kidney disease, or unspecified chronic kidney disease: Secondary | ICD-10-CM | POA: Diagnosis not present

## 2013-11-02 DIAGNOSIS — Z7982 Long term (current) use of aspirin: Secondary | ICD-10-CM | POA: Diagnosis not present

## 2013-11-02 DIAGNOSIS — G4733 Obstructive sleep apnea (adult) (pediatric): Secondary | ICD-10-CM | POA: Diagnosis not present

## 2013-11-02 DIAGNOSIS — N186 End stage renal disease: Secondary | ICD-10-CM | POA: Diagnosis not present

## 2013-11-02 DIAGNOSIS — Z79899 Other long term (current) drug therapy: Secondary | ICD-10-CM | POA: Diagnosis not present

## 2013-11-02 DIAGNOSIS — I2510 Atherosclerotic heart disease of native coronary artery without angina pectoris: Secondary | ICD-10-CM | POA: Diagnosis not present

## 2013-11-02 DIAGNOSIS — R079 Chest pain, unspecified: Secondary | ICD-10-CM | POA: Diagnosis not present

## 2013-11-02 DIAGNOSIS — I120 Hypertensive chronic kidney disease with stage 5 chronic kidney disease or end stage renal disease: Secondary | ICD-10-CM | POA: Diagnosis not present

## 2013-11-02 NOTE — Telephone Encounter (Signed)
Phone call from pt.  Reports a 2 mo. Hx of dull pain in right arm.  States the pain is intermittent, and starts in the upper arm and travels down the arm to the hand @ times.  States at other times the pain starts just below the right ear and travels down the right side of neck and into the right shoulder and down the arm.  Also states the entire right arm aches at times.  Denies any swelling in right shoulder or arm.  Relates hx of having been evaluated last mo. by his PCP for ear infection; stated no infection found.  Also reports that he hasn't started dialysis, so access has not been used.  Discussed pt. symptoms w/ Dr. Oneida Alar.  Recommended an office evaluation; no vasc. study intially.   Pt. Will be notified of appt.

## 2013-11-03 DIAGNOSIS — R072 Precordial pain: Secondary | ICD-10-CM | POA: Diagnosis not present

## 2013-11-03 NOTE — Telephone Encounter (Signed)
i have left several messages with appt date and time, but pt has not called back, dpm

## 2013-11-07 DIAGNOSIS — Z794 Long term (current) use of insulin: Secondary | ICD-10-CM | POA: Diagnosis not present

## 2013-11-07 DIAGNOSIS — R079 Chest pain, unspecified: Secondary | ICD-10-CM | POA: Diagnosis not present

## 2013-11-07 DIAGNOSIS — Z79899 Other long term (current) drug therapy: Secondary | ICD-10-CM | POA: Diagnosis not present

## 2013-11-07 DIAGNOSIS — I10 Essential (primary) hypertension: Secondary | ICD-10-CM | POA: Diagnosis not present

## 2013-11-07 DIAGNOSIS — R110 Nausea: Secondary | ICD-10-CM | POA: Diagnosis not present

## 2013-11-07 DIAGNOSIS — R0789 Other chest pain: Secondary | ICD-10-CM | POA: Diagnosis not present

## 2013-11-07 DIAGNOSIS — E119 Type 2 diabetes mellitus without complications: Secondary | ICD-10-CM | POA: Diagnosis not present

## 2013-11-07 DIAGNOSIS — R0602 Shortness of breath: Secondary | ICD-10-CM | POA: Diagnosis not present

## 2013-11-07 DIAGNOSIS — I2510 Atherosclerotic heart disease of native coronary artery without angina pectoris: Secondary | ICD-10-CM | POA: Diagnosis not present

## 2013-11-07 DIAGNOSIS — E7800 Pure hypercholesterolemia, unspecified: Secondary | ICD-10-CM | POA: Diagnosis not present

## 2013-11-07 DIAGNOSIS — I509 Heart failure, unspecified: Secondary | ICD-10-CM | POA: Diagnosis not present

## 2013-11-07 DIAGNOSIS — K219 Gastro-esophageal reflux disease without esophagitis: Secondary | ICD-10-CM | POA: Diagnosis not present

## 2013-11-07 DIAGNOSIS — I252 Old myocardial infarction: Secondary | ICD-10-CM | POA: Diagnosis not present

## 2013-11-07 DIAGNOSIS — J40 Bronchitis, not specified as acute or chronic: Secondary | ICD-10-CM | POA: Diagnosis not present

## 2013-11-10 ENCOUNTER — Encounter: Payer: Self-pay | Admitting: Surgery

## 2013-11-13 ENCOUNTER — Ambulatory Visit (INDEPENDENT_AMBULATORY_CARE_PROVIDER_SITE_OTHER): Payer: Medicare Other | Admitting: Surgery

## 2013-11-13 ENCOUNTER — Encounter: Payer: Self-pay | Admitting: Surgery

## 2013-11-13 VITALS — BP 125/56 | HR 57 | Ht 68.0 in | Wt 224.9 lb

## 2013-11-13 DIAGNOSIS — N186 End stage renal disease: Secondary | ICD-10-CM

## 2013-11-13 DIAGNOSIS — T82898A Other specified complication of vascular prosthetic devices, implants and grafts, initial encounter: Secondary | ICD-10-CM | POA: Insufficient documentation

## 2013-11-13 DIAGNOSIS — R0789 Other chest pain: Secondary | ICD-10-CM | POA: Diagnosis not present

## 2013-11-13 HISTORY — DX: Other specified complication of vascular prosthetic devices, implants and grafts, initial encounter: T82.898A

## 2013-11-13 NOTE — Progress Notes (Signed)
Patient name: Carlos Heath MRN: [id number] DOB: 1965/12/27 Sex: male     Chief Complaint  Patient presents with  . Re-evaluation    c/o pain in R arm X 1 month     HISTORY OF PRESENT ILLNESS: The patient is here today with complaints of pain in his right arm.  He has a history of right basilic vein transposition.  He is not yet on dialysis.  He states that the pain is constant there are no aggravating or relieving factors.  Is not positional.  It can happen at any point in time during the day.  The pain goes from the shoulder down to his hand.  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  Past Medical History  Diagnosis Date  . CAD (coronary artery disease)   . Carotid artery occlusion   . Hypertension   . Hyperlipidemia   . Depression   . Heart murmur   . Anxiety   . Complication of anesthesia     difficulty urinating  . CHF (congestive heart failure)   . Anginal pain   . Myocardial infarction ~ 2005    "couple they suspect" (07/01/2012)  . DVT (deep venous thrombosis) 2011    "RLE; after I went home from a cath; loaded me up w/heparin, lovenox" (07/01/2012)  . Exertional dyspnea   . OSA on CPAP 2011  . Type II diabetes mellitus 05/2012  . Iron deficiency anemia 2012    "had to get shots q week for awhile" (07/01/2012)  . B12 deficiency anemia     "get shot q month" (07/01/2012)  . History of blood transfusion ?2012    "laceration right wrist; lost lots of blood" (07/01/2012)  . GERD (gastroesophageal reflux disease)   . Migraines     "monthly" (07/01/2012)  . Gouty arthritis   . Carpal tunnel syndrome on both sides     "wore braces for awhile" (07/01/2012)  . ESRD (end stage renal disease)     "have 12% function" (07/01/2012)    Past Surgical History  Procedure Laterality Date  . Av fistula placement  ?2007    right antecub (07/01/2012)  . Anterior cervical decomp/discectomy fusion  ~ 2000    "w/plate" (07/01/2012)  . Coronary angioplasty with  stent placement  2011 X 3    "1 + 2 + 2; 5 total" (07/01/2012)  . Tonsillectomy  1970's  . Vein surgery  03/05/2010    "moved vein up into arm for dialysis access; was too deep"; right upper arm" (07/01/2012)  . Laceration repair  2012    "right wrist; fell thru a glass" (07/01/2012)    History   Social History  . Marital Status: Single    Spouse Name: N/A    Number of Children: N/A  . Years of Education: N/A   Occupational History  . Not on file.   Social History Main Topics  . Smoking status: Former Smoker -- 1.00 packs/day for 35 years    Types: Cigarettes    Quit date: 01/11/2011  . Smokeless tobacco: Never Used  . Alcohol Use: Yes     Comment: 07/01/2012 "never drank much; occasionally only; last drink ~ 2 yr ago"  . Drug Use: No  . Sexual Activity: No   Other Topics Concern  . Not on file   Social History Narrative  . No narrative on file    Family History  Problem Relation Age of Onset  . Heart disease Mother  before age 48  . Stroke Mother   . Hyperlipidemia Mother   . Hypertension Mother   . Heart attack Mother   . Other Mother     bleeding problems  . Heart disease Father     before age 64  . Hyperlipidemia Father   . Hypertension Father   . Heart attack Father   . Hypertension Brother     Allergies as of 11/13/2013 - Review Complete 11/13/2013  Allergen Reaction Noted  . Hydrocodone Nausea And Vomiting 03/18/2011  . Norvasc [amlodipine besylate] Hives and Rash 03/18/2011  . Lisinopril Rash 12/15/2011  . Morphine and related Nausea And Vomiting and Other (See Comments) 03/18/2011    Current Outpatient Prescriptions on File Prior to Visit  Medication Sig Dispense Refill  . aspirin 325 MG EC tablet Take 325 mg by mouth daily.      Marland Kitchen atorvastatin (LIPITOR) 40 MG tablet Take 40 mg by mouth every evening.      . cloNIDine (CATAPRES) 0.1 MG tablet Take 2 tablets (0.2 mg total) by mouth 2 (two) times daily.  60 tablet  0  . desvenlafaxine  (PRISTIQ) 50 MG 24 hr tablet Take 100 mg by mouth every evening.       . dicyclomine (BENTYL) 10 MG capsule Take 10 mg by mouth 4 (four) times daily -  before meals and at bedtime.      . febuxostat (ULORIC) 40 MG tablet Take 40 mg by mouth daily.       . insulin glargine (LANTUS) 100 UNIT/ML injection Inject 20 Units into the skin 2 (two) times daily as needed. Sliding scale      . labetalol (NORMODYNE) 300 MG tablet Take 300 mg by mouth 2 (two) times daily.      . metolazone (ZAROXOLYN) 2.5 MG tablet Take 2.5-5 mg by mouth 3 (three) times a week. Mon, Wed, and Fri      . minoxidil (LONITEN) 10 MG tablet Take 10 mg by mouth 2 (two) times daily.        . nitroGLYCERIN (NITROSTAT) 0.4 MG SL tablet Place 0.4 mg under the tongue every 5 (five) minutes as needed. For chest pain      . omeprazole (PRILOSEC) 40 MG capsule Take 40 mg by mouth 2 (two) times daily.       . ondansetron (ZOFRAN) 8 MG tablet Take 8 mg by mouth every 8 (eight) hours as needed. For nausea      . oxycodone (OXY-IR) 5 MG capsule Take 5-10 mg by mouth every 4 (four) hours as needed. For pain      . potassium chloride SA (K-DUR,KLOR-CON) 20 MEQ tablet Take 80 mEq by mouth 2 (two) times daily.       . predniSONE (DELTASONE) 20 MG tablet Take 20 mg by mouth daily as needed. For gout pain      . ranolazine (RANEXA) 1000 MG SR tablet Take 1,000 mg by mouth 2 (two) times daily.       Marland Kitchen zolpidem (AMBIEN) 10 MG tablet Take 10 mg by mouth at bedtime.       . calcium acetate (PHOSLO) 667 MG capsule Take 1,334 mg by mouth 3 (three) times daily with meals.       No current facility-administered medications on file prior to visit.     REVIEW OF SYSTEMS: Cardiovascular: Positive for chest pain and varicose veins Pulmonary: No productive cough, asthma or wheezing. Neurologic: Positive for numbness and arm. No dizziness. Hematologic: No bleeding  problems or clotting disorders. Musculoskeletal: No joint pain or joint  swelling. Gastrointestinal: No blood in stool or hematemesis Genitourinary: No dysuria or hematuria. Psychiatric:: No history of major depression. Integumentary: No rashes or ulcers. Constitutional: No fever or chills.  PHYSICAL EXAMINATION:   Vital signs are BP 125/56  Pulse 57  Ht 5\' 8"  (1.727 m)  Wt 224 lb 14.4 oz (102.014 kg)  BMI 34.20 kg/m2  SpO2 99% General: The patient appears their stated age. HEENT:  No gross abnormalities Pulmonary:  Non labored breathing Abdomen: Soft and non-tender Musculoskeletal: There are no major deformities. Neurologic: No focal weakness or paresthesias are detected, Skin: There are no ulcer or rashes noted. Psychiatric: The patient has normal affect. Cardiovascular: There is a regular rate and rhythm without significant murmur appreciated.  Excellent thrill within right basilic vein transposition.  I cannot palpate a right radial pulse.  There is no edema within the right arm   Diagnostic Studies None  Assessment: Status post basilic vein transposition with right arm pain Plan: The patient describes pain going from the shoulder to his hand.  This has been going on for approximately one month.  He does not endorse any swelling in his arm.  There is excellent thrill within his basilic vein transposition.  It is unclear exactly why the patient is having this discomfort.  Potentially could be secondary to nerve irritation from his fistula.  He has no evidence of swelling in his hand so I doubt that there is a more proximal stenosis within his fistula.  He does not give any evidence of steal syndrome either.  I discussed possibly getting an ultrasound or a fistulogram.  Because he is not yet on dialysis, I would rather avoid giving him contrast.  We'll continue to monitor his symptoms if he has worsening, he would get an ultrasound.  Eldridge Abrahams, M.D. [facility] Office: [phone] Pager:  [phone]

## 2013-11-14 DIAGNOSIS — R0789 Other chest pain: Secondary | ICD-10-CM | POA: Diagnosis not present

## 2013-11-14 DIAGNOSIS — I252 Old myocardial infarction: Secondary | ICD-10-CM | POA: Diagnosis not present

## 2013-11-14 DIAGNOSIS — I2510 Atherosclerotic heart disease of native coronary artery without angina pectoris: Secondary | ICD-10-CM | POA: Diagnosis not present

## 2013-11-14 DIAGNOSIS — R0989 Other specified symptoms and signs involving the circulatory and respiratory systems: Secondary | ICD-10-CM | POA: Diagnosis not present

## 2013-11-14 DIAGNOSIS — E785 Hyperlipidemia, unspecified: Secondary | ICD-10-CM | POA: Diagnosis not present

## 2013-11-14 DIAGNOSIS — N189 Chronic kidney disease, unspecified: Secondary | ICD-10-CM | POA: Diagnosis not present

## 2013-11-14 DIAGNOSIS — G4730 Sleep apnea, unspecified: Secondary | ICD-10-CM | POA: Diagnosis not present

## 2013-11-17 DIAGNOSIS — M25539 Pain in unspecified wrist: Secondary | ICD-10-CM | POA: Diagnosis not present

## 2013-11-17 DIAGNOSIS — M79609 Pain in unspecified limb: Secondary | ICD-10-CM | POA: Diagnosis not present

## 2013-11-17 DIAGNOSIS — E7800 Pure hypercholesterolemia, unspecified: Secondary | ICD-10-CM | POA: Diagnosis not present

## 2013-11-17 DIAGNOSIS — Z794 Long term (current) use of insulin: Secondary | ICD-10-CM | POA: Diagnosis not present

## 2013-11-17 DIAGNOSIS — M109 Gout, unspecified: Secondary | ICD-10-CM | POA: Diagnosis not present

## 2013-11-17 DIAGNOSIS — E119 Type 2 diabetes mellitus without complications: Secondary | ICD-10-CM | POA: Diagnosis not present

## 2013-11-17 DIAGNOSIS — M25579 Pain in unspecified ankle and joints of unspecified foot: Secondary | ICD-10-CM | POA: Diagnosis not present

## 2013-11-17 DIAGNOSIS — I509 Heart failure, unspecified: Secondary | ICD-10-CM | POA: Diagnosis not present

## 2013-11-17 DIAGNOSIS — I10 Essential (primary) hypertension: Secondary | ICD-10-CM | POA: Diagnosis not present

## 2013-11-17 DIAGNOSIS — K219 Gastro-esophageal reflux disease without esophagitis: Secondary | ICD-10-CM | POA: Diagnosis not present

## 2013-11-17 DIAGNOSIS — Z79899 Other long term (current) drug therapy: Secondary | ICD-10-CM | POA: Diagnosis not present

## 2013-11-17 DIAGNOSIS — I2510 Atherosclerotic heart disease of native coronary artery without angina pectoris: Secondary | ICD-10-CM | POA: Diagnosis not present

## 2013-11-23 DIAGNOSIS — M109 Gout, unspecified: Secondary | ICD-10-CM | POA: Diagnosis not present

## 2013-11-23 DIAGNOSIS — M25549 Pain in joints of unspecified hand: Secondary | ICD-10-CM | POA: Diagnosis not present

## 2013-11-24 DIAGNOSIS — M109 Gout, unspecified: Secondary | ICD-10-CM | POA: Diagnosis not present

## 2013-11-24 DIAGNOSIS — M25549 Pain in joints of unspecified hand: Secondary | ICD-10-CM | POA: Diagnosis not present

## 2013-11-29 DIAGNOSIS — E1129 Type 2 diabetes mellitus with other diabetic kidney complication: Secondary | ICD-10-CM | POA: Diagnosis not present

## 2013-11-29 DIAGNOSIS — N183 Chronic kidney disease, stage 3 unspecified: Secondary | ICD-10-CM | POA: Diagnosis not present

## 2013-12-02 DIAGNOSIS — R42 Dizziness and giddiness: Secondary | ICD-10-CM | POA: Diagnosis not present

## 2013-12-02 DIAGNOSIS — R002 Palpitations: Secondary | ICD-10-CM | POA: Diagnosis not present

## 2014-01-04 DIAGNOSIS — D631 Anemia in chronic kidney disease: Secondary | ICD-10-CM | POA: Diagnosis not present

## 2014-01-04 DIAGNOSIS — N185 Chronic kidney disease, stage 5: Secondary | ICD-10-CM | POA: Diagnosis not present

## 2014-01-04 DIAGNOSIS — N189 Chronic kidney disease, unspecified: Secondary | ICD-10-CM | POA: Diagnosis not present

## 2014-01-23 DIAGNOSIS — N184 Chronic kidney disease, stage 4 (severe): Secondary | ICD-10-CM | POA: Diagnosis not present

## 2014-01-23 DIAGNOSIS — E1165 Type 2 diabetes mellitus with hyperglycemia: Secondary | ICD-10-CM | POA: Diagnosis not present

## 2014-01-23 DIAGNOSIS — E782 Mixed hyperlipidemia: Secondary | ICD-10-CM | POA: Diagnosis not present

## 2014-01-23 DIAGNOSIS — I10 Essential (primary) hypertension: Secondary | ICD-10-CM | POA: Diagnosis not present

## 2014-01-23 DIAGNOSIS — F331 Major depressive disorder, recurrent, moderate: Secondary | ICD-10-CM | POA: Diagnosis not present

## 2014-01-23 DIAGNOSIS — R599 Enlarged lymph nodes, unspecified: Secondary | ICD-10-CM | POA: Diagnosis not present

## 2014-01-23 DIAGNOSIS — E1129 Type 2 diabetes mellitus with other diabetic kidney complication: Secondary | ICD-10-CM | POA: Diagnosis not present

## 2014-01-23 DIAGNOSIS — K219 Gastro-esophageal reflux disease without esophagitis: Secondary | ICD-10-CM | POA: Diagnosis not present

## 2014-01-26 DIAGNOSIS — I2510 Atherosclerotic heart disease of native coronary artery without angina pectoris: Secondary | ICD-10-CM | POA: Diagnosis not present

## 2014-01-26 DIAGNOSIS — E785 Hyperlipidemia, unspecified: Secondary | ICD-10-CM | POA: Diagnosis not present

## 2014-01-26 DIAGNOSIS — I10 Essential (primary) hypertension: Secondary | ICD-10-CM | POA: Diagnosis not present

## 2014-01-26 DIAGNOSIS — G4733 Obstructive sleep apnea (adult) (pediatric): Secondary | ICD-10-CM | POA: Diagnosis not present

## 2014-02-26 DIAGNOSIS — D631 Anemia in chronic kidney disease: Secondary | ICD-10-CM | POA: Diagnosis not present

## 2014-02-26 DIAGNOSIS — N185 Chronic kidney disease, stage 5: Secondary | ICD-10-CM | POA: Diagnosis not present

## 2014-02-28 DIAGNOSIS — R599 Enlarged lymph nodes, unspecified: Secondary | ICD-10-CM | POA: Diagnosis not present

## 2014-03-30 DIAGNOSIS — D631 Anemia in chronic kidney disease: Secondary | ICD-10-CM | POA: Diagnosis not present

## 2014-03-30 DIAGNOSIS — N039 Chronic nephritic syndrome with unspecified morphologic changes: Secondary | ICD-10-CM | POA: Diagnosis not present

## 2014-03-30 DIAGNOSIS — N185 Chronic kidney disease, stage 5: Secondary | ICD-10-CM | POA: Diagnosis not present

## 2014-04-05 DIAGNOSIS — D649 Anemia, unspecified: Secondary | ICD-10-CM | POA: Diagnosis not present

## 2014-04-05 DIAGNOSIS — E7800 Pure hypercholesterolemia, unspecified: Secondary | ICD-10-CM | POA: Diagnosis not present

## 2014-04-05 DIAGNOSIS — I129 Hypertensive chronic kidney disease with stage 1 through stage 4 chronic kidney disease, or unspecified chronic kidney disease: Secondary | ICD-10-CM | POA: Diagnosis not present

## 2014-04-05 DIAGNOSIS — J440 Chronic obstructive pulmonary disease with acute lower respiratory infection: Secondary | ICD-10-CM | POA: Diagnosis not present

## 2014-04-05 DIAGNOSIS — I2510 Atherosclerotic heart disease of native coronary artery without angina pectoris: Secondary | ICD-10-CM | POA: Diagnosis not present

## 2014-04-05 DIAGNOSIS — N184 Chronic kidney disease, stage 4 (severe): Secondary | ICD-10-CM | POA: Diagnosis not present

## 2014-04-05 DIAGNOSIS — E119 Type 2 diabetes mellitus without complications: Secondary | ICD-10-CM | POA: Diagnosis not present

## 2014-04-05 DIAGNOSIS — R0602 Shortness of breath: Secondary | ICD-10-CM | POA: Diagnosis not present

## 2014-04-05 DIAGNOSIS — Z794 Long term (current) use of insulin: Secondary | ICD-10-CM | POA: Diagnosis not present

## 2014-04-05 DIAGNOSIS — K219 Gastro-esophageal reflux disease without esophagitis: Secondary | ICD-10-CM | POA: Diagnosis not present

## 2014-04-05 DIAGNOSIS — R059 Cough, unspecified: Secondary | ICD-10-CM | POA: Diagnosis not present

## 2014-04-05 DIAGNOSIS — Z79899 Other long term (current) drug therapy: Secondary | ICD-10-CM | POA: Diagnosis not present

## 2014-04-05 DIAGNOSIS — J209 Acute bronchitis, unspecified: Secondary | ICD-10-CM | POA: Diagnosis not present

## 2014-04-05 DIAGNOSIS — J441 Chronic obstructive pulmonary disease with (acute) exacerbation: Secondary | ICD-10-CM | POA: Diagnosis not present

## 2014-04-05 DIAGNOSIS — I509 Heart failure, unspecified: Secondary | ICD-10-CM | POA: Diagnosis not present

## 2014-04-05 DIAGNOSIS — R05 Cough: Secondary | ICD-10-CM | POA: Diagnosis not present

## 2014-04-16 DIAGNOSIS — E119 Type 2 diabetes mellitus without complications: Secondary | ICD-10-CM | POA: Diagnosis not present

## 2014-04-16 DIAGNOSIS — I252 Old myocardial infarction: Secondary | ICD-10-CM | POA: Diagnosis not present

## 2014-04-16 DIAGNOSIS — Z23 Encounter for immunization: Secondary | ICD-10-CM | POA: Diagnosis not present

## 2014-04-26 DIAGNOSIS — N189 Chronic kidney disease, unspecified: Secondary | ICD-10-CM | POA: Diagnosis not present

## 2014-04-26 DIAGNOSIS — N185 Chronic kidney disease, stage 5: Secondary | ICD-10-CM | POA: Diagnosis not present

## 2014-05-14 DIAGNOSIS — I252 Old myocardial infarction: Secondary | ICD-10-CM | POA: Diagnosis not present

## 2014-05-21 DIAGNOSIS — I130 Hypertensive heart and chronic kidney disease with heart failure and stage 1 through stage 4 chronic kidney disease, or unspecified chronic kidney disease: Secondary | ICD-10-CM | POA: Diagnosis not present

## 2014-05-21 DIAGNOSIS — I252 Old myocardial infarction: Secondary | ICD-10-CM | POA: Diagnosis not present

## 2014-05-21 DIAGNOSIS — E782 Mixed hyperlipidemia: Secondary | ICD-10-CM | POA: Diagnosis not present

## 2014-05-21 DIAGNOSIS — N184 Chronic kidney disease, stage 4 (severe): Secondary | ICD-10-CM | POA: Diagnosis not present

## 2014-05-21 DIAGNOSIS — K219 Gastro-esophageal reflux disease without esophagitis: Secondary | ICD-10-CM | POA: Diagnosis not present

## 2014-05-21 DIAGNOSIS — F331 Major depressive disorder, recurrent, moderate: Secondary | ICD-10-CM | POA: Diagnosis not present

## 2014-05-21 DIAGNOSIS — I1310 Hypertensive heart and chronic kidney disease without heart failure, with stage 1 through stage 4 chronic kidney disease, or unspecified chronic kidney disease: Secondary | ICD-10-CM | POA: Diagnosis not present

## 2014-05-21 DIAGNOSIS — E1122 Type 2 diabetes mellitus with diabetic chronic kidney disease: Secondary | ICD-10-CM | POA: Diagnosis not present

## 2014-06-26 DIAGNOSIS — N189 Chronic kidney disease, unspecified: Secondary | ICD-10-CM | POA: Diagnosis not present

## 2014-06-26 DIAGNOSIS — N185 Chronic kidney disease, stage 5: Secondary | ICD-10-CM | POA: Diagnosis not present

## 2014-06-27 DIAGNOSIS — E1129 Type 2 diabetes mellitus with other diabetic kidney complication: Secondary | ICD-10-CM | POA: Diagnosis not present

## 2014-06-27 DIAGNOSIS — N183 Chronic kidney disease, stage 3 (moderate): Secondary | ICD-10-CM | POA: Diagnosis not present

## 2014-07-18 DIAGNOSIS — E785 Hyperlipidemia, unspecified: Secondary | ICD-10-CM | POA: Diagnosis not present

## 2014-07-18 DIAGNOSIS — R002 Palpitations: Secondary | ICD-10-CM | POA: Diagnosis not present

## 2014-07-18 DIAGNOSIS — I2510 Atherosclerotic heart disease of native coronary artery without angina pectoris: Secondary | ICD-10-CM | POA: Diagnosis not present

## 2014-07-18 DIAGNOSIS — I10 Essential (primary) hypertension: Secondary | ICD-10-CM | POA: Diagnosis not present

## 2014-07-18 DIAGNOSIS — I272 Other secondary pulmonary hypertension: Secondary | ICD-10-CM | POA: Diagnosis not present

## 2014-07-18 DIAGNOSIS — G4730 Sleep apnea, unspecified: Secondary | ICD-10-CM | POA: Diagnosis not present

## 2014-07-18 DIAGNOSIS — N189 Chronic kidney disease, unspecified: Secondary | ICD-10-CM | POA: Diagnosis not present

## 2014-09-07 DIAGNOSIS — N189 Chronic kidney disease, unspecified: Secondary | ICD-10-CM | POA: Diagnosis not present

## 2014-09-07 DIAGNOSIS — N185 Chronic kidney disease, stage 5: Secondary | ICD-10-CM | POA: Diagnosis not present

## 2014-09-10 DIAGNOSIS — R51 Headache: Secondary | ICD-10-CM | POA: Diagnosis not present

## 2014-09-10 DIAGNOSIS — Z79899 Other long term (current) drug therapy: Secondary | ICD-10-CM | POA: Diagnosis not present

## 2014-09-10 DIAGNOSIS — I252 Old myocardial infarction: Secondary | ICD-10-CM | POA: Diagnosis not present

## 2014-09-10 DIAGNOSIS — R112 Nausea with vomiting, unspecified: Secondary | ICD-10-CM | POA: Diagnosis not present

## 2014-09-10 DIAGNOSIS — R05 Cough: Secondary | ICD-10-CM | POA: Diagnosis not present

## 2014-09-10 DIAGNOSIS — M791 Myalgia: Secondary | ICD-10-CM | POA: Diagnosis not present

## 2014-09-10 DIAGNOSIS — E780 Pure hypercholesterolemia: Secondary | ICD-10-CM | POA: Diagnosis not present

## 2014-09-10 DIAGNOSIS — I509 Heart failure, unspecified: Secondary | ICD-10-CM | POA: Diagnosis not present

## 2014-09-10 DIAGNOSIS — E876 Hypokalemia: Secondary | ICD-10-CM | POA: Diagnosis not present

## 2014-09-10 DIAGNOSIS — R109 Unspecified abdominal pain: Secondary | ICD-10-CM | POA: Diagnosis not present

## 2014-09-10 DIAGNOSIS — R079 Chest pain, unspecified: Secondary | ICD-10-CM | POA: Diagnosis not present

## 2014-09-10 DIAGNOSIS — Z794 Long term (current) use of insulin: Secondary | ICD-10-CM | POA: Diagnosis not present

## 2014-09-10 DIAGNOSIS — Z7952 Long term (current) use of systemic steroids: Secondary | ICD-10-CM | POA: Diagnosis not present

## 2014-09-10 DIAGNOSIS — N184 Chronic kidney disease, stage 4 (severe): Secondary | ICD-10-CM | POA: Diagnosis not present

## 2014-09-10 DIAGNOSIS — Z955 Presence of coronary angioplasty implant and graft: Secondary | ICD-10-CM | POA: Diagnosis not present

## 2014-09-10 DIAGNOSIS — I129 Hypertensive chronic kidney disease with stage 1 through stage 4 chronic kidney disease, or unspecified chronic kidney disease: Secondary | ICD-10-CM | POA: Diagnosis not present

## 2014-09-10 DIAGNOSIS — N179 Acute kidney failure, unspecified: Secondary | ICD-10-CM | POA: Diagnosis not present

## 2014-09-10 DIAGNOSIS — I2510 Atherosclerotic heart disease of native coronary artery without angina pectoris: Secondary | ICD-10-CM | POA: Diagnosis not present

## 2014-09-10 DIAGNOSIS — N281 Cyst of kidney, acquired: Secondary | ICD-10-CM | POA: Diagnosis not present

## 2014-09-10 DIAGNOSIS — Z7982 Long term (current) use of aspirin: Secondary | ICD-10-CM | POA: Diagnosis not present

## 2014-09-10 DIAGNOSIS — E119 Type 2 diabetes mellitus without complications: Secondary | ICD-10-CM | POA: Diagnosis not present

## 2014-09-10 DIAGNOSIS — R9431 Abnormal electrocardiogram [ECG] [EKG]: Secondary | ICD-10-CM | POA: Diagnosis not present

## 2014-09-10 DIAGNOSIS — K219 Gastro-esophageal reflux disease without esophagitis: Secondary | ICD-10-CM | POA: Diagnosis not present

## 2014-09-10 DIAGNOSIS — E860 Dehydration: Secondary | ICD-10-CM | POA: Diagnosis not present

## 2014-09-10 DIAGNOSIS — K521 Toxic gastroenteritis and colitis: Secondary | ICD-10-CM | POA: Diagnosis not present

## 2014-09-11 DIAGNOSIS — E860 Dehydration: Secondary | ICD-10-CM | POA: Diagnosis not present

## 2014-09-11 DIAGNOSIS — K521 Toxic gastroenteritis and colitis: Secondary | ICD-10-CM | POA: Diagnosis not present

## 2014-09-11 DIAGNOSIS — E876 Hypokalemia: Secondary | ICD-10-CM | POA: Diagnosis not present

## 2014-10-02 DIAGNOSIS — E1122 Type 2 diabetes mellitus with diabetic chronic kidney disease: Secondary | ICD-10-CM | POA: Diagnosis not present

## 2014-10-02 DIAGNOSIS — E782 Mixed hyperlipidemia: Secondary | ICD-10-CM | POA: Diagnosis not present

## 2014-10-02 DIAGNOSIS — N184 Chronic kidney disease, stage 4 (severe): Secondary | ICD-10-CM | POA: Diagnosis not present

## 2014-10-02 DIAGNOSIS — J029 Acute pharyngitis, unspecified: Secondary | ICD-10-CM | POA: Diagnosis not present

## 2014-10-02 DIAGNOSIS — E876 Hypokalemia: Secondary | ICD-10-CM | POA: Diagnosis not present

## 2014-10-02 DIAGNOSIS — R1011 Right upper quadrant pain: Secondary | ICD-10-CM | POA: Diagnosis not present

## 2014-10-02 DIAGNOSIS — K219 Gastro-esophageal reflux disease without esophagitis: Secondary | ICD-10-CM | POA: Diagnosis not present

## 2014-10-02 DIAGNOSIS — R10811 Right upper quadrant abdominal tenderness: Secondary | ICD-10-CM | POA: Diagnosis not present

## 2014-10-02 DIAGNOSIS — I130 Hypertensive heart and chronic kidney disease with heart failure and stage 1 through stage 4 chronic kidney disease, or unspecified chronic kidney disease: Secondary | ICD-10-CM | POA: Diagnosis not present

## 2014-10-10 DIAGNOSIS — N281 Cyst of kidney, acquired: Secondary | ICD-10-CM | POA: Diagnosis not present

## 2014-10-10 DIAGNOSIS — R10811 Right upper quadrant abdominal tenderness: Secondary | ICD-10-CM | POA: Diagnosis not present

## 2014-11-01 DIAGNOSIS — R2681 Unsteadiness on feet: Secondary | ICD-10-CM | POA: Diagnosis not present

## 2014-11-01 DIAGNOSIS — R0602 Shortness of breath: Secondary | ICD-10-CM | POA: Diagnosis not present

## 2014-11-01 DIAGNOSIS — R112 Nausea with vomiting, unspecified: Secondary | ICD-10-CM | POA: Diagnosis not present

## 2014-11-01 DIAGNOSIS — I6782 Cerebral ischemia: Secondary | ICD-10-CM | POA: Diagnosis not present

## 2014-11-01 DIAGNOSIS — R0789 Other chest pain: Secondary | ICD-10-CM | POA: Diagnosis not present

## 2014-11-01 DIAGNOSIS — R42 Dizziness and giddiness: Secondary | ICD-10-CM | POA: Diagnosis not present

## 2014-11-12 DIAGNOSIS — E1122 Type 2 diabetes mellitus with diabetic chronic kidney disease: Secondary | ICD-10-CM | POA: Diagnosis not present

## 2014-11-12 DIAGNOSIS — E1169 Type 2 diabetes mellitus with other specified complication: Secondary | ICD-10-CM | POA: Diagnosis not present

## 2014-11-12 DIAGNOSIS — I150 Renovascular hypertension: Secondary | ICD-10-CM | POA: Diagnosis not present

## 2014-11-12 DIAGNOSIS — E785 Hyperlipidemia, unspecified: Secondary | ICD-10-CM | POA: Diagnosis not present

## 2014-12-17 DIAGNOSIS — E1169 Type 2 diabetes mellitus with other specified complication: Secondary | ICD-10-CM | POA: Diagnosis not present

## 2014-12-17 DIAGNOSIS — E785 Hyperlipidemia, unspecified: Secondary | ICD-10-CM | POA: Diagnosis not present

## 2014-12-17 DIAGNOSIS — N183 Chronic kidney disease, stage 3 (moderate): Secondary | ICD-10-CM | POA: Diagnosis not present

## 2014-12-17 DIAGNOSIS — I150 Renovascular hypertension: Secondary | ICD-10-CM | POA: Diagnosis not present

## 2014-12-25 DIAGNOSIS — Z1389 Encounter for screening for other disorder: Secondary | ICD-10-CM | POA: Diagnosis not present

## 2014-12-25 DIAGNOSIS — Z139 Encounter for screening, unspecified: Secondary | ICD-10-CM | POA: Diagnosis not present

## 2014-12-25 DIAGNOSIS — Z0000 Encounter for general adult medical examination without abnormal findings: Secondary | ICD-10-CM | POA: Diagnosis not present

## 2015-01-08 DIAGNOSIS — N2581 Secondary hyperparathyroidism of renal origin: Secondary | ICD-10-CM | POA: Diagnosis not present

## 2015-01-08 DIAGNOSIS — E1129 Type 2 diabetes mellitus with other diabetic kidney complication: Secondary | ICD-10-CM | POA: Diagnosis not present

## 2015-01-08 DIAGNOSIS — N183 Chronic kidney disease, stage 3 (moderate): Secondary | ICD-10-CM | POA: Diagnosis not present

## 2015-01-16 DIAGNOSIS — R51 Headache: Secondary | ICD-10-CM | POA: Diagnosis not present

## 2015-01-16 DIAGNOSIS — R200 Anesthesia of skin: Secondary | ICD-10-CM | POA: Diagnosis not present

## 2015-01-16 DIAGNOSIS — R410 Disorientation, unspecified: Secondary | ICD-10-CM | POA: Diagnosis not present

## 2015-01-16 DIAGNOSIS — J811 Chronic pulmonary edema: Secondary | ICD-10-CM | POA: Diagnosis not present

## 2015-01-16 DIAGNOSIS — I517 Cardiomegaly: Secondary | ICD-10-CM | POA: Diagnosis not present

## 2015-01-31 DIAGNOSIS — I2510 Atherosclerotic heart disease of native coronary artery without angina pectoris: Secondary | ICD-10-CM

## 2015-01-31 DIAGNOSIS — N183 Chronic kidney disease, stage 3 (moderate): Secondary | ICD-10-CM | POA: Diagnosis not present

## 2015-01-31 DIAGNOSIS — N184 Chronic kidney disease, stage 4 (severe): Secondary | ICD-10-CM

## 2015-01-31 DIAGNOSIS — E785 Hyperlipidemia, unspecified: Secondary | ICD-10-CM

## 2015-01-31 DIAGNOSIS — I10 Essential (primary) hypertension: Secondary | ICD-10-CM | POA: Diagnosis not present

## 2015-01-31 DIAGNOSIS — I25110 Atherosclerotic heart disease of native coronary artery with unstable angina pectoris: Secondary | ICD-10-CM | POA: Diagnosis not present

## 2015-01-31 HISTORY — DX: Hyperlipidemia, unspecified: E78.5

## 2015-01-31 HISTORY — DX: Chronic kidney disease, stage 4 (severe): N18.4

## 2015-01-31 HISTORY — DX: Atherosclerotic heart disease of native coronary artery without angina pectoris: I25.10

## 2015-02-07 DIAGNOSIS — I517 Cardiomegaly: Secondary | ICD-10-CM | POA: Diagnosis not present

## 2015-02-11 DIAGNOSIS — I10 Essential (primary) hypertension: Secondary | ICD-10-CM | POA: Diagnosis not present

## 2015-02-11 DIAGNOSIS — R1084 Generalized abdominal pain: Secondary | ICD-10-CM | POA: Diagnosis not present

## 2015-02-11 DIAGNOSIS — I509 Heart failure, unspecified: Secondary | ICD-10-CM | POA: Diagnosis not present

## 2015-02-11 DIAGNOSIS — Z7982 Long term (current) use of aspirin: Secondary | ICD-10-CM | POA: Diagnosis not present

## 2015-02-11 DIAGNOSIS — I2510 Atherosclerotic heart disease of native coronary artery without angina pectoris: Secondary | ICD-10-CM | POA: Diagnosis not present

## 2015-02-11 DIAGNOSIS — E119 Type 2 diabetes mellitus without complications: Secondary | ICD-10-CM | POA: Diagnosis not present

## 2015-02-11 DIAGNOSIS — E780 Pure hypercholesterolemia: Secondary | ICD-10-CM | POA: Diagnosis not present

## 2015-02-11 DIAGNOSIS — Z79899 Other long term (current) drug therapy: Secondary | ICD-10-CM | POA: Diagnosis not present

## 2015-02-11 DIAGNOSIS — K219 Gastro-esophageal reflux disease without esophagitis: Secondary | ICD-10-CM | POA: Diagnosis not present

## 2015-02-11 DIAGNOSIS — F17200 Nicotine dependence, unspecified, uncomplicated: Secondary | ICD-10-CM | POA: Diagnosis not present

## 2015-02-12 DIAGNOSIS — I25110 Atherosclerotic heart disease of native coronary artery with unstable angina pectoris: Secondary | ICD-10-CM | POA: Diagnosis not present

## 2015-02-19 DIAGNOSIS — H43811 Vitreous degeneration, right eye: Secondary | ICD-10-CM | POA: Diagnosis not present

## 2015-02-19 DIAGNOSIS — E119 Type 2 diabetes mellitus without complications: Secondary | ICD-10-CM | POA: Diagnosis not present

## 2015-02-19 DIAGNOSIS — H35373 Puckering of macula, bilateral: Secondary | ICD-10-CM | POA: Diagnosis not present

## 2015-03-26 DIAGNOSIS — R0789 Other chest pain: Secondary | ICD-10-CM | POA: Diagnosis not present

## 2015-03-26 DIAGNOSIS — R002 Palpitations: Secondary | ICD-10-CM | POA: Diagnosis not present

## 2015-03-26 DIAGNOSIS — I509 Heart failure, unspecified: Secondary | ICD-10-CM | POA: Diagnosis not present

## 2015-03-26 DIAGNOSIS — R079 Chest pain, unspecified: Secondary | ICD-10-CM | POA: Diagnosis not present

## 2015-03-26 DIAGNOSIS — R0602 Shortness of breath: Secondary | ICD-10-CM | POA: Diagnosis not present

## 2015-03-26 DIAGNOSIS — S29012A Strain of muscle and tendon of back wall of thorax, initial encounter: Secondary | ICD-10-CM | POA: Diagnosis not present

## 2015-03-28 DIAGNOSIS — Z6831 Body mass index (BMI) 31.0-31.9, adult: Secondary | ICD-10-CM | POA: Diagnosis not present

## 2015-03-28 DIAGNOSIS — S46911D Strain of unspecified muscle, fascia and tendon at shoulder and upper arm level, right arm, subsequent encounter: Secondary | ICD-10-CM | POA: Diagnosis not present

## 2015-03-28 DIAGNOSIS — R252 Cramp and spasm: Secondary | ICD-10-CM | POA: Diagnosis not present

## 2015-04-11 DIAGNOSIS — Z6831 Body mass index (BMI) 31.0-31.9, adult: Secondary | ICD-10-CM | POA: Diagnosis not present

## 2015-04-11 DIAGNOSIS — S46911D Strain of unspecified muscle, fascia and tendon at shoulder and upper arm level, right arm, subsequent encounter: Secondary | ICD-10-CM | POA: Diagnosis not present

## 2015-04-18 DIAGNOSIS — E785 Hyperlipidemia, unspecified: Secondary | ICD-10-CM | POA: Diagnosis not present

## 2015-04-18 DIAGNOSIS — E1169 Type 2 diabetes mellitus with other specified complication: Secondary | ICD-10-CM | POA: Diagnosis not present

## 2015-04-18 DIAGNOSIS — I150 Renovascular hypertension: Secondary | ICD-10-CM | POA: Diagnosis not present

## 2015-04-25 DIAGNOSIS — I150 Renovascular hypertension: Secondary | ICD-10-CM | POA: Diagnosis not present

## 2015-04-25 DIAGNOSIS — E785 Hyperlipidemia, unspecified: Secondary | ICD-10-CM | POA: Diagnosis not present

## 2015-04-25 DIAGNOSIS — Z23 Encounter for immunization: Secondary | ICD-10-CM | POA: Diagnosis not present

## 2015-04-25 DIAGNOSIS — E1169 Type 2 diabetes mellitus with other specified complication: Secondary | ICD-10-CM | POA: Diagnosis not present

## 2015-06-11 DIAGNOSIS — M10071 Idiopathic gout, right ankle and foot: Secondary | ICD-10-CM | POA: Diagnosis not present

## 2015-06-11 DIAGNOSIS — E119 Type 2 diabetes mellitus without complications: Secondary | ICD-10-CM | POA: Diagnosis not present

## 2015-06-11 DIAGNOSIS — M109 Gout, unspecified: Secondary | ICD-10-CM | POA: Diagnosis not present

## 2015-06-15 DIAGNOSIS — R000 Tachycardia, unspecified: Secondary | ICD-10-CM | POA: Diagnosis not present

## 2015-06-15 DIAGNOSIS — R0789 Other chest pain: Secondary | ICD-10-CM | POA: Diagnosis not present

## 2015-06-15 DIAGNOSIS — I4892 Unspecified atrial flutter: Secondary | ICD-10-CM | POA: Diagnosis not present

## 2015-06-16 DIAGNOSIS — Z885 Allergy status to narcotic agent status: Secondary | ICD-10-CM | POA: Diagnosis not present

## 2015-06-16 DIAGNOSIS — R0789 Other chest pain: Secondary | ICD-10-CM | POA: Diagnosis not present

## 2015-06-16 DIAGNOSIS — I482 Chronic atrial fibrillation: Secondary | ICD-10-CM | POA: Diagnosis not present

## 2015-06-16 DIAGNOSIS — I5032 Chronic diastolic (congestive) heart failure: Secondary | ICD-10-CM | POA: Diagnosis present

## 2015-06-16 DIAGNOSIS — I2510 Atherosclerotic heart disease of native coronary artery without angina pectoris: Secondary | ICD-10-CM | POA: Diagnosis not present

## 2015-06-16 DIAGNOSIS — Z79899 Other long term (current) drug therapy: Secondary | ICD-10-CM | POA: Diagnosis not present

## 2015-06-16 DIAGNOSIS — R079 Chest pain, unspecified: Secondary | ICD-10-CM | POA: Diagnosis not present

## 2015-06-16 DIAGNOSIS — R000 Tachycardia, unspecified: Secondary | ICD-10-CM | POA: Diagnosis not present

## 2015-06-16 DIAGNOSIS — I252 Old myocardial infarction: Secondary | ICD-10-CM | POA: Diagnosis not present

## 2015-06-16 DIAGNOSIS — Z9861 Coronary angioplasty status: Secondary | ICD-10-CM | POA: Diagnosis not present

## 2015-06-16 DIAGNOSIS — I429 Cardiomyopathy, unspecified: Secondary | ICD-10-CM | POA: Diagnosis present

## 2015-06-16 DIAGNOSIS — I129 Hypertensive chronic kidney disease with stage 1 through stage 4 chronic kidney disease, or unspecified chronic kidney disease: Secondary | ICD-10-CM | POA: Diagnosis present

## 2015-06-16 DIAGNOSIS — I4891 Unspecified atrial fibrillation: Secondary | ICD-10-CM | POA: Diagnosis not present

## 2015-06-16 DIAGNOSIS — F17210 Nicotine dependence, cigarettes, uncomplicated: Secondary | ICD-10-CM | POA: Diagnosis present

## 2015-06-16 DIAGNOSIS — K219 Gastro-esophageal reflux disease without esophagitis: Secondary | ICD-10-CM | POA: Diagnosis present

## 2015-06-16 DIAGNOSIS — E7800 Pure hypercholesterolemia, unspecified: Secondary | ICD-10-CM | POA: Diagnosis present

## 2015-06-16 DIAGNOSIS — E876 Hypokalemia: Secondary | ICD-10-CM | POA: Diagnosis not present

## 2015-06-16 DIAGNOSIS — I209 Angina pectoris, unspecified: Secondary | ICD-10-CM | POA: Diagnosis not present

## 2015-06-16 DIAGNOSIS — N184 Chronic kidney disease, stage 4 (severe): Secondary | ICD-10-CM | POA: Diagnosis not present

## 2015-06-16 DIAGNOSIS — R002 Palpitations: Secondary | ICD-10-CM | POA: Diagnosis not present

## 2015-06-16 DIAGNOSIS — G4733 Obstructive sleep apnea (adult) (pediatric): Secondary | ICD-10-CM | POA: Diagnosis not present

## 2015-06-16 DIAGNOSIS — I4892 Unspecified atrial flutter: Secondary | ICD-10-CM | POA: Diagnosis not present

## 2015-06-16 DIAGNOSIS — Z7982 Long term (current) use of aspirin: Secondary | ICD-10-CM | POA: Diagnosis not present

## 2015-06-16 DIAGNOSIS — M109 Gout, unspecified: Secondary | ICD-10-CM | POA: Diagnosis present

## 2015-06-16 DIAGNOSIS — F419 Anxiety disorder, unspecified: Secondary | ICD-10-CM | POA: Diagnosis present

## 2015-06-16 DIAGNOSIS — Z888 Allergy status to other drugs, medicaments and biological substances status: Secondary | ICD-10-CM | POA: Diagnosis not present

## 2015-06-26 DIAGNOSIS — E1129 Type 2 diabetes mellitus with other diabetic kidney complication: Secondary | ICD-10-CM | POA: Diagnosis not present

## 2015-06-26 DIAGNOSIS — N183 Chronic kidney disease, stage 3 (moderate): Secondary | ICD-10-CM | POA: Diagnosis not present

## 2015-06-26 DIAGNOSIS — N2581 Secondary hyperparathyroidism of renal origin: Secondary | ICD-10-CM | POA: Diagnosis not present

## 2015-07-01 DIAGNOSIS — N184 Chronic kidney disease, stage 4 (severe): Secondary | ICD-10-CM | POA: Diagnosis not present

## 2015-07-01 DIAGNOSIS — I4891 Unspecified atrial fibrillation: Secondary | ICD-10-CM | POA: Diagnosis not present

## 2015-07-01 DIAGNOSIS — Z955 Presence of coronary angioplasty implant and graft: Secondary | ICD-10-CM | POA: Diagnosis not present

## 2015-07-01 DIAGNOSIS — I2510 Atherosclerotic heart disease of native coronary artery without angina pectoris: Secondary | ICD-10-CM | POA: Diagnosis not present

## 2015-07-11 DIAGNOSIS — I481 Persistent atrial fibrillation: Secondary | ICD-10-CM | POA: Diagnosis not present

## 2015-07-11 DIAGNOSIS — I25110 Atherosclerotic heart disease of native coronary artery with unstable angina pectoris: Secondary | ICD-10-CM | POA: Diagnosis not present

## 2015-07-11 DIAGNOSIS — I10 Essential (primary) hypertension: Secondary | ICD-10-CM | POA: Diagnosis not present

## 2015-07-11 DIAGNOSIS — N183 Chronic kidney disease, stage 3 (moderate): Secondary | ICD-10-CM | POA: Diagnosis not present

## 2015-07-11 DIAGNOSIS — E785 Hyperlipidemia, unspecified: Secondary | ICD-10-CM | POA: Diagnosis not present

## 2015-08-04 DIAGNOSIS — I517 Cardiomegaly: Secondary | ICD-10-CM | POA: Diagnosis not present

## 2015-08-04 DIAGNOSIS — F17210 Nicotine dependence, cigarettes, uncomplicated: Secondary | ICD-10-CM | POA: Diagnosis present

## 2015-08-04 DIAGNOSIS — Z8679 Personal history of other diseases of the circulatory system: Secondary | ICD-10-CM | POA: Diagnosis not present

## 2015-08-04 DIAGNOSIS — F419 Anxiety disorder, unspecified: Secondary | ICD-10-CM | POA: Diagnosis not present

## 2015-08-04 DIAGNOSIS — I481 Persistent atrial fibrillation: Secondary | ICD-10-CM | POA: Diagnosis not present

## 2015-08-04 DIAGNOSIS — Z716 Tobacco abuse counseling: Secondary | ICD-10-CM | POA: Diagnosis not present

## 2015-08-04 DIAGNOSIS — N189 Chronic kidney disease, unspecified: Secondary | ICD-10-CM | POA: Diagnosis not present

## 2015-08-04 DIAGNOSIS — I509 Heart failure, unspecified: Secondary | ICD-10-CM | POA: Diagnosis present

## 2015-08-04 DIAGNOSIS — I25110 Atherosclerotic heart disease of native coronary artery with unstable angina pectoris: Secondary | ICD-10-CM | POA: Diagnosis present

## 2015-08-04 DIAGNOSIS — G4733 Obstructive sleep apnea (adult) (pediatric): Secondary | ICD-10-CM | POA: Diagnosis not present

## 2015-08-04 DIAGNOSIS — I4891 Unspecified atrial fibrillation: Secondary | ICD-10-CM | POA: Diagnosis not present

## 2015-08-04 DIAGNOSIS — R911 Solitary pulmonary nodule: Secondary | ICD-10-CM | POA: Diagnosis not present

## 2015-08-04 DIAGNOSIS — I444 Left anterior fascicular block: Secondary | ICD-10-CM | POA: Diagnosis not present

## 2015-08-04 DIAGNOSIS — R51 Headache: Secondary | ICD-10-CM | POA: Diagnosis not present

## 2015-08-04 DIAGNOSIS — I129 Hypertensive chronic kidney disease with stage 1 through stage 4 chronic kidney disease, or unspecified chronic kidney disease: Secondary | ICD-10-CM | POA: Diagnosis not present

## 2015-08-04 DIAGNOSIS — I739 Peripheral vascular disease, unspecified: Secondary | ICD-10-CM | POA: Diagnosis not present

## 2015-08-04 DIAGNOSIS — R079 Chest pain, unspecified: Secondary | ICD-10-CM | POA: Diagnosis not present

## 2015-08-04 DIAGNOSIS — I272 Other secondary pulmonary hypertension: Secondary | ICD-10-CM | POA: Diagnosis not present

## 2015-08-04 DIAGNOSIS — I088 Other rheumatic multiple valve diseases: Secondary | ICD-10-CM | POA: Diagnosis not present

## 2015-08-04 DIAGNOSIS — I130 Hypertensive heart and chronic kidney disease with heart failure and stage 1 through stage 4 chronic kidney disease, or unspecified chronic kidney disease: Secondary | ICD-10-CM | POA: Diagnosis present

## 2015-08-04 DIAGNOSIS — R0602 Shortness of breath: Secondary | ICD-10-CM | POA: Diagnosis not present

## 2015-08-04 DIAGNOSIS — N184 Chronic kidney disease, stage 4 (severe): Secondary | ICD-10-CM | POA: Diagnosis present

## 2015-08-04 DIAGNOSIS — Z955 Presence of coronary angioplasty implant and graft: Secondary | ICD-10-CM | POA: Diagnosis not present

## 2015-08-04 DIAGNOSIS — I200 Unstable angina: Secondary | ICD-10-CM | POA: Diagnosis not present

## 2015-08-05 DIAGNOSIS — R079 Chest pain, unspecified: Secondary | ICD-10-CM

## 2015-08-05 HISTORY — DX: Chest pain, unspecified: R07.9

## 2015-08-13 DIAGNOSIS — I481 Persistent atrial fibrillation: Secondary | ICD-10-CM | POA: Diagnosis not present

## 2015-08-13 DIAGNOSIS — N183 Chronic kidney disease, stage 3 (moderate): Secondary | ICD-10-CM | POA: Diagnosis not present

## 2015-08-13 DIAGNOSIS — I10 Essential (primary) hypertension: Secondary | ICD-10-CM | POA: Diagnosis not present

## 2015-08-13 DIAGNOSIS — I25110 Atherosclerotic heart disease of native coronary artery with unstable angina pectoris: Secondary | ICD-10-CM | POA: Diagnosis not present

## 2015-08-13 DIAGNOSIS — E785 Hyperlipidemia, unspecified: Secondary | ICD-10-CM | POA: Diagnosis not present

## 2015-09-02 DIAGNOSIS — E785 Hyperlipidemia, unspecified: Secondary | ICD-10-CM | POA: Diagnosis not present

## 2015-09-02 DIAGNOSIS — I10 Essential (primary) hypertension: Secondary | ICD-10-CM | POA: Diagnosis not present

## 2015-09-02 DIAGNOSIS — N183 Chronic kidney disease, stage 3 (moderate): Secondary | ICD-10-CM | POA: Diagnosis not present

## 2015-09-02 DIAGNOSIS — J9811 Atelectasis: Secondary | ICD-10-CM | POA: Diagnosis not present

## 2015-09-02 DIAGNOSIS — J019 Acute sinusitis, unspecified: Secondary | ICD-10-CM | POA: Diagnosis not present

## 2015-09-02 DIAGNOSIS — I4891 Unspecified atrial fibrillation: Secondary | ICD-10-CM | POA: Diagnosis not present

## 2015-09-02 DIAGNOSIS — I517 Cardiomegaly: Secondary | ICD-10-CM | POA: Diagnosis not present

## 2015-09-02 DIAGNOSIS — I25110 Atherosclerotic heart disease of native coronary artery with unstable angina pectoris: Secondary | ICD-10-CM | POA: Diagnosis not present

## 2015-09-02 DIAGNOSIS — I481 Persistent atrial fibrillation: Secondary | ICD-10-CM | POA: Diagnosis not present

## 2015-09-02 DIAGNOSIS — Z6831 Body mass index (BMI) 31.0-31.9, adult: Secondary | ICD-10-CM | POA: Diagnosis not present

## 2015-09-02 DIAGNOSIS — R05 Cough: Secondary | ICD-10-CM | POA: Diagnosis not present

## 2015-09-04 DIAGNOSIS — Z79899 Other long term (current) drug therapy: Secondary | ICD-10-CM | POA: Diagnosis not present

## 2015-09-04 DIAGNOSIS — Z7902 Long term (current) use of antithrombotics/antiplatelets: Secondary | ICD-10-CM | POA: Diagnosis not present

## 2015-09-04 DIAGNOSIS — Z955 Presence of coronary angioplasty implant and graft: Secondary | ICD-10-CM | POA: Diagnosis not present

## 2015-09-04 DIAGNOSIS — I129 Hypertensive chronic kidney disease with stage 1 through stage 4 chronic kidney disease, or unspecified chronic kidney disease: Secondary | ICD-10-CM | POA: Diagnosis not present

## 2015-09-04 DIAGNOSIS — I739 Peripheral vascular disease, unspecified: Secondary | ICD-10-CM | POA: Diagnosis not present

## 2015-09-04 DIAGNOSIS — I481 Persistent atrial fibrillation: Secondary | ICD-10-CM | POA: Diagnosis not present

## 2015-09-04 DIAGNOSIS — E785 Hyperlipidemia, unspecified: Secondary | ICD-10-CM | POA: Diagnosis not present

## 2015-09-04 DIAGNOSIS — Z7901 Long term (current) use of anticoagulants: Secondary | ICD-10-CM | POA: Diagnosis not present

## 2015-09-04 DIAGNOSIS — I4891 Unspecified atrial fibrillation: Secondary | ICD-10-CM | POA: Diagnosis not present

## 2015-09-04 DIAGNOSIS — I2510 Atherosclerotic heart disease of native coronary artery without angina pectoris: Secondary | ICD-10-CM | POA: Diagnosis not present

## 2015-09-04 DIAGNOSIS — N183 Chronic kidney disease, stage 3 (moderate): Secondary | ICD-10-CM | POA: Diagnosis not present

## 2015-09-04 DIAGNOSIS — I272 Other secondary pulmonary hypertension: Secondary | ICD-10-CM | POA: Diagnosis not present

## 2015-09-04 DIAGNOSIS — J449 Chronic obstructive pulmonary disease, unspecified: Secondary | ICD-10-CM | POA: Diagnosis not present

## 2015-09-04 DIAGNOSIS — I480 Paroxysmal atrial fibrillation: Secondary | ICD-10-CM | POA: Diagnosis not present

## 2015-09-16 DIAGNOSIS — I4891 Unspecified atrial fibrillation: Secondary | ICD-10-CM | POA: Diagnosis not present

## 2015-09-18 DIAGNOSIS — L03115 Cellulitis of right lower limb: Secondary | ICD-10-CM | POA: Diagnosis not present

## 2015-09-18 DIAGNOSIS — Z6831 Body mass index (BMI) 31.0-31.9, adult: Secondary | ICD-10-CM | POA: Diagnosis not present

## 2015-09-18 DIAGNOSIS — L03114 Cellulitis of left upper limb: Secondary | ICD-10-CM | POA: Diagnosis not present

## 2015-09-30 DIAGNOSIS — Z6831 Body mass index (BMI) 31.0-31.9, adult: Secondary | ICD-10-CM | POA: Diagnosis not present

## 2015-09-30 DIAGNOSIS — L03114 Cellulitis of left upper limb: Secondary | ICD-10-CM | POA: Diagnosis not present

## 2015-09-30 DIAGNOSIS — I4891 Unspecified atrial fibrillation: Secondary | ICD-10-CM | POA: Diagnosis not present

## 2015-09-30 DIAGNOSIS — E1129 Type 2 diabetes mellitus with other diabetic kidney complication: Secondary | ICD-10-CM | POA: Diagnosis not present

## 2015-09-30 DIAGNOSIS — B359 Dermatophytosis, unspecified: Secondary | ICD-10-CM | POA: Diagnosis not present

## 2015-10-16 DIAGNOSIS — Z6832 Body mass index (BMI) 32.0-32.9, adult: Secondary | ICD-10-CM | POA: Diagnosis not present

## 2015-10-16 DIAGNOSIS — L03114 Cellulitis of left upper limb: Secondary | ICD-10-CM | POA: Diagnosis not present

## 2015-10-17 DIAGNOSIS — E785 Hyperlipidemia, unspecified: Secondary | ICD-10-CM | POA: Diagnosis not present

## 2015-10-17 DIAGNOSIS — I10 Essential (primary) hypertension: Secondary | ICD-10-CM | POA: Diagnosis not present

## 2015-10-17 DIAGNOSIS — I25110 Atherosclerotic heart disease of native coronary artery with unstable angina pectoris: Secondary | ICD-10-CM | POA: Diagnosis not present

## 2015-10-17 DIAGNOSIS — I480 Paroxysmal atrial fibrillation: Secondary | ICD-10-CM

## 2015-10-17 DIAGNOSIS — N183 Chronic kidney disease, stage 3 (moderate): Secondary | ICD-10-CM | POA: Diagnosis not present

## 2015-10-17 HISTORY — DX: Paroxysmal atrial fibrillation: I48.0

## 2015-10-25 DIAGNOSIS — R531 Weakness: Secondary | ICD-10-CM | POA: Diagnosis not present

## 2015-10-25 DIAGNOSIS — R079 Chest pain, unspecified: Secondary | ICD-10-CM | POA: Diagnosis not present

## 2015-10-25 DIAGNOSIS — R51 Headache: Secondary | ICD-10-CM | POA: Diagnosis not present

## 2015-10-25 DIAGNOSIS — E860 Dehydration: Secondary | ICD-10-CM | POA: Diagnosis not present

## 2015-10-28 DIAGNOSIS — R002 Palpitations: Secondary | ICD-10-CM | POA: Diagnosis not present

## 2015-10-29 DIAGNOSIS — I480 Paroxysmal atrial fibrillation: Secondary | ICD-10-CM | POA: Diagnosis not present

## 2015-11-14 DIAGNOSIS — M109 Gout, unspecified: Secondary | ICD-10-CM | POA: Diagnosis not present

## 2015-11-14 DIAGNOSIS — Z6833 Body mass index (BMI) 33.0-33.9, adult: Secondary | ICD-10-CM | POA: Diagnosis not present

## 2015-11-14 DIAGNOSIS — L03114 Cellulitis of left upper limb: Secondary | ICD-10-CM | POA: Diagnosis not present

## 2015-11-14 DIAGNOSIS — F17200 Nicotine dependence, unspecified, uncomplicated: Secondary | ICD-10-CM | POA: Diagnosis not present

## 2015-11-26 DIAGNOSIS — I480 Paroxysmal atrial fibrillation: Secondary | ICD-10-CM | POA: Diagnosis not present

## 2015-11-26 DIAGNOSIS — I25110 Atherosclerotic heart disease of native coronary artery with unstable angina pectoris: Secondary | ICD-10-CM | POA: Diagnosis not present

## 2015-11-26 DIAGNOSIS — N19 Unspecified kidney failure: Secondary | ICD-10-CM | POA: Diagnosis not present

## 2015-11-26 DIAGNOSIS — N183 Chronic kidney disease, stage 3 (moderate): Secondary | ICD-10-CM | POA: Diagnosis not present

## 2015-11-26 DIAGNOSIS — I130 Hypertensive heart and chronic kidney disease with heart failure and stage 1 through stage 4 chronic kidney disease, or unspecified chronic kidney disease: Secondary | ICD-10-CM | POA: Diagnosis not present

## 2015-11-26 DIAGNOSIS — I159 Secondary hypertension, unspecified: Secondary | ICD-10-CM | POA: Diagnosis not present

## 2015-11-26 DIAGNOSIS — E785 Hyperlipidemia, unspecified: Secondary | ICD-10-CM | POA: Diagnosis not present

## 2015-11-26 DIAGNOSIS — I2510 Atherosclerotic heart disease of native coronary artery without angina pectoris: Secondary | ICD-10-CM | POA: Diagnosis not present

## 2015-11-26 DIAGNOSIS — I10 Essential (primary) hypertension: Secondary | ICD-10-CM | POA: Diagnosis not present

## 2015-11-26 DIAGNOSIS — I471 Supraventricular tachycardia: Secondary | ICD-10-CM

## 2015-11-26 DIAGNOSIS — I4891 Unspecified atrial fibrillation: Secondary | ICD-10-CM | POA: Diagnosis not present

## 2015-11-26 HISTORY — DX: Supraventricular tachycardia: I47.1

## 2015-12-12 DIAGNOSIS — K589 Irritable bowel syndrome without diarrhea: Secondary | ICD-10-CM | POA: Diagnosis not present

## 2015-12-12 DIAGNOSIS — Z1211 Encounter for screening for malignant neoplasm of colon: Secondary | ICD-10-CM | POA: Diagnosis not present

## 2015-12-12 DIAGNOSIS — R1010 Upper abdominal pain, unspecified: Secondary | ICD-10-CM | POA: Diagnosis not present

## 2015-12-12 DIAGNOSIS — R160 Hepatomegaly, not elsewhere classified: Secondary | ICD-10-CM | POA: Diagnosis not present

## 2015-12-14 DIAGNOSIS — Z955 Presence of coronary angioplasty implant and graft: Secondary | ICD-10-CM | POA: Diagnosis not present

## 2015-12-14 DIAGNOSIS — R140 Abdominal distension (gaseous): Secondary | ICD-10-CM | POA: Diagnosis not present

## 2015-12-14 DIAGNOSIS — T148 Other injury of unspecified body region: Secondary | ICD-10-CM | POA: Diagnosis not present

## 2015-12-14 DIAGNOSIS — R197 Diarrhea, unspecified: Secondary | ICD-10-CM | POA: Diagnosis not present

## 2015-12-14 DIAGNOSIS — R630 Anorexia: Secondary | ICD-10-CM | POA: Diagnosis not present

## 2015-12-14 DIAGNOSIS — N184 Chronic kidney disease, stage 4 (severe): Secondary | ICD-10-CM | POA: Diagnosis not present

## 2015-12-14 DIAGNOSIS — R531 Weakness: Secondary | ICD-10-CM | POA: Diagnosis not present

## 2015-12-14 DIAGNOSIS — R188 Other ascites: Secondary | ICD-10-CM | POA: Diagnosis not present

## 2015-12-14 DIAGNOSIS — Z8249 Family history of ischemic heart disease and other diseases of the circulatory system: Secondary | ICD-10-CM | POA: Diagnosis not present

## 2015-12-14 DIAGNOSIS — R0600 Dyspnea, unspecified: Secondary | ICD-10-CM | POA: Diagnosis not present

## 2015-12-14 DIAGNOSIS — M549 Dorsalgia, unspecified: Secondary | ICD-10-CM | POA: Diagnosis not present

## 2015-12-14 DIAGNOSIS — F17200 Nicotine dependence, unspecified, uncomplicated: Secondary | ICD-10-CM | POA: Diagnosis not present

## 2015-12-14 DIAGNOSIS — I2510 Atherosclerotic heart disease of native coronary artery without angina pectoris: Secondary | ICD-10-CM | POA: Diagnosis not present

## 2015-12-14 DIAGNOSIS — E785 Hyperlipidemia, unspecified: Secondary | ICD-10-CM | POA: Diagnosis not present

## 2015-12-14 DIAGNOSIS — I480 Paroxysmal atrial fibrillation: Secondary | ICD-10-CM | POA: Diagnosis not present

## 2015-12-14 DIAGNOSIS — J8410 Pulmonary fibrosis, unspecified: Secondary | ICD-10-CM | POA: Diagnosis not present

## 2015-12-14 DIAGNOSIS — R5383 Other fatigue: Secondary | ICD-10-CM | POA: Diagnosis not present

## 2015-12-14 DIAGNOSIS — I272 Other secondary pulmonary hypertension: Secondary | ICD-10-CM | POA: Diagnosis not present

## 2015-12-14 DIAGNOSIS — I129 Hypertensive chronic kidney disease with stage 1 through stage 4 chronic kidney disease, or unspecified chronic kidney disease: Secondary | ICD-10-CM | POA: Diagnosis not present

## 2015-12-14 DIAGNOSIS — K7460 Unspecified cirrhosis of liver: Secondary | ICD-10-CM | POA: Diagnosis not present

## 2015-12-14 DIAGNOSIS — D696 Thrombocytopenia, unspecified: Secondary | ICD-10-CM | POA: Diagnosis not present

## 2015-12-14 DIAGNOSIS — Z7901 Long term (current) use of anticoagulants: Secondary | ICD-10-CM | POA: Diagnosis not present

## 2015-12-14 DIAGNOSIS — R0602 Shortness of breath: Secondary | ICD-10-CM | POA: Diagnosis not present

## 2015-12-14 DIAGNOSIS — N189 Chronic kidney disease, unspecified: Secondary | ICD-10-CM | POA: Diagnosis not present

## 2015-12-14 DIAGNOSIS — Z885 Allergy status to narcotic agent status: Secondary | ICD-10-CM | POA: Diagnosis not present

## 2015-12-15 DIAGNOSIS — R5383 Other fatigue: Secondary | ICD-10-CM | POA: Diagnosis not present

## 2015-12-15 DIAGNOSIS — R531 Weakness: Secondary | ICD-10-CM | POA: Diagnosis not present

## 2015-12-15 DIAGNOSIS — K7460 Unspecified cirrhosis of liver: Secondary | ICD-10-CM | POA: Diagnosis not present

## 2015-12-15 DIAGNOSIS — Z7901 Long term (current) use of anticoagulants: Secondary | ICD-10-CM | POA: Diagnosis not present

## 2015-12-15 DIAGNOSIS — N184 Chronic kidney disease, stage 4 (severe): Secondary | ICD-10-CM | POA: Diagnosis not present

## 2015-12-15 DIAGNOSIS — R140 Abdominal distension (gaseous): Secondary | ICD-10-CM | POA: Diagnosis not present

## 2015-12-15 DIAGNOSIS — D696 Thrombocytopenia, unspecified: Secondary | ICD-10-CM | POA: Diagnosis not present

## 2015-12-15 DIAGNOSIS — R0600 Dyspnea, unspecified: Secondary | ICD-10-CM | POA: Diagnosis not present

## 2015-12-15 DIAGNOSIS — I480 Paroxysmal atrial fibrillation: Secondary | ICD-10-CM | POA: Diagnosis not present

## 2015-12-16 DIAGNOSIS — I130 Hypertensive heart and chronic kidney disease with heart failure and stage 1 through stage 4 chronic kidney disease, or unspecified chronic kidney disease: Secondary | ICD-10-CM | POA: Diagnosis not present

## 2015-12-16 DIAGNOSIS — R932 Abnormal findings on diagnostic imaging of liver and biliary tract: Secondary | ICD-10-CM | POA: Diagnosis not present

## 2015-12-16 DIAGNOSIS — R1010 Upper abdominal pain, unspecified: Secondary | ICD-10-CM | POA: Diagnosis not present

## 2015-12-16 DIAGNOSIS — N281 Cyst of kidney, acquired: Secondary | ICD-10-CM | POA: Diagnosis not present

## 2015-12-16 DIAGNOSIS — R160 Hepatomegaly, not elsewhere classified: Secondary | ICD-10-CM | POA: Diagnosis not present

## 2015-12-16 DIAGNOSIS — I2510 Atherosclerotic heart disease of native coronary artery without angina pectoris: Secondary | ICD-10-CM | POA: Diagnosis not present

## 2015-12-16 DIAGNOSIS — K828 Other specified diseases of gallbladder: Secondary | ICD-10-CM | POA: Diagnosis not present

## 2015-12-16 DIAGNOSIS — N189 Chronic kidney disease, unspecified: Secondary | ICD-10-CM | POA: Diagnosis not present

## 2015-12-16 DIAGNOSIS — R188 Other ascites: Secondary | ICD-10-CM | POA: Diagnosis not present

## 2015-12-20 DIAGNOSIS — R140 Abdominal distension (gaseous): Secondary | ICD-10-CM | POA: Diagnosis not present

## 2015-12-20 DIAGNOSIS — I129 Hypertensive chronic kidney disease with stage 1 through stage 4 chronic kidney disease, or unspecified chronic kidney disease: Secondary | ICD-10-CM | POA: Diagnosis not present

## 2015-12-20 DIAGNOSIS — Z79899 Other long term (current) drug therapy: Secondary | ICD-10-CM | POA: Diagnosis not present

## 2015-12-20 DIAGNOSIS — I2510 Atherosclerotic heart disease of native coronary artery without angina pectoris: Secondary | ICD-10-CM | POA: Diagnosis not present

## 2015-12-20 DIAGNOSIS — Z7902 Long term (current) use of antithrombotics/antiplatelets: Secondary | ICD-10-CM | POA: Diagnosis not present

## 2015-12-20 DIAGNOSIS — M7989 Other specified soft tissue disorders: Secondary | ICD-10-CM | POA: Diagnosis not present

## 2015-12-20 DIAGNOSIS — G4730 Sleep apnea, unspecified: Secondary | ICD-10-CM | POA: Diagnosis not present

## 2015-12-20 DIAGNOSIS — Z8249 Family history of ischemic heart disease and other diseases of the circulatory system: Secondary | ICD-10-CM | POA: Diagnosis not present

## 2015-12-20 DIAGNOSIS — R0609 Other forms of dyspnea: Secondary | ICD-10-CM | POA: Diagnosis not present

## 2015-12-20 DIAGNOSIS — Z888 Allergy status to other drugs, medicaments and biological substances status: Secondary | ICD-10-CM | POA: Diagnosis not present

## 2015-12-20 DIAGNOSIS — R0789 Other chest pain: Secondary | ICD-10-CM | POA: Diagnosis not present

## 2015-12-20 DIAGNOSIS — K7460 Unspecified cirrhosis of liver: Secondary | ICD-10-CM | POA: Diagnosis not present

## 2015-12-20 DIAGNOSIS — E8779 Other fluid overload: Secondary | ICD-10-CM | POA: Diagnosis not present

## 2015-12-20 DIAGNOSIS — F17200 Nicotine dependence, unspecified, uncomplicated: Secondary | ICD-10-CM | POA: Diagnosis not present

## 2015-12-20 DIAGNOSIS — Z7901 Long term (current) use of anticoagulants: Secondary | ICD-10-CM | POA: Diagnosis not present

## 2015-12-20 DIAGNOSIS — N184 Chronic kidney disease, stage 4 (severe): Secondary | ICD-10-CM | POA: Diagnosis not present

## 2015-12-20 DIAGNOSIS — Z885 Allergy status to narcotic agent status: Secondary | ICD-10-CM | POA: Diagnosis not present

## 2015-12-20 DIAGNOSIS — E785 Hyperlipidemia, unspecified: Secondary | ICD-10-CM | POA: Diagnosis not present

## 2015-12-20 DIAGNOSIS — Z955 Presence of coronary angioplasty implant and graft: Secondary | ICD-10-CM | POA: Diagnosis not present

## 2015-12-20 DIAGNOSIS — I272 Other secondary pulmonary hypertension: Secondary | ICD-10-CM | POA: Diagnosis not present

## 2015-12-26 DIAGNOSIS — I25110 Atherosclerotic heart disease of native coronary artery with unstable angina pectoris: Secondary | ICD-10-CM | POA: Diagnosis not present

## 2015-12-26 DIAGNOSIS — N183 Chronic kidney disease, stage 3 (moderate): Secondary | ICD-10-CM | POA: Diagnosis not present

## 2015-12-26 DIAGNOSIS — E785 Hyperlipidemia, unspecified: Secondary | ICD-10-CM | POA: Diagnosis not present

## 2015-12-26 DIAGNOSIS — I10 Essential (primary) hypertension: Secondary | ICD-10-CM | POA: Diagnosis not present

## 2015-12-26 DIAGNOSIS — I480 Paroxysmal atrial fibrillation: Secondary | ICD-10-CM | POA: Diagnosis not present

## 2015-12-26 DIAGNOSIS — I2510 Atherosclerotic heart disease of native coronary artery without angina pectoris: Secondary | ICD-10-CM | POA: Diagnosis not present

## 2015-12-28 DIAGNOSIS — I509 Heart failure, unspecified: Secondary | ICD-10-CM | POA: Diagnosis not present

## 2015-12-28 DIAGNOSIS — N186 End stage renal disease: Secondary | ICD-10-CM | POA: Diagnosis not present

## 2015-12-28 DIAGNOSIS — M79661 Pain in right lower leg: Secondary | ICD-10-CM | POA: Diagnosis not present

## 2015-12-28 DIAGNOSIS — I129 Hypertensive chronic kidney disease with stage 1 through stage 4 chronic kidney disease, or unspecified chronic kidney disease: Secondary | ICD-10-CM | POA: Diagnosis not present

## 2015-12-28 DIAGNOSIS — Z7901 Long term (current) use of anticoagulants: Secondary | ICD-10-CM | POA: Diagnosis not present

## 2015-12-28 DIAGNOSIS — N189 Chronic kidney disease, unspecified: Secondary | ICD-10-CM | POA: Diagnosis not present

## 2015-12-28 DIAGNOSIS — I4891 Unspecified atrial fibrillation: Secondary | ICD-10-CM | POA: Diagnosis not present

## 2015-12-28 DIAGNOSIS — F17210 Nicotine dependence, cigarettes, uncomplicated: Secondary | ICD-10-CM | POA: Diagnosis not present

## 2015-12-28 DIAGNOSIS — Z7902 Long term (current) use of antithrombotics/antiplatelets: Secondary | ICD-10-CM | POA: Diagnosis not present

## 2015-12-28 DIAGNOSIS — Z955 Presence of coronary angioplasty implant and graft: Secondary | ICD-10-CM | POA: Diagnosis not present

## 2015-12-28 DIAGNOSIS — I809 Phlebitis and thrombophlebitis of unspecified site: Secondary | ICD-10-CM | POA: Diagnosis not present

## 2015-12-28 DIAGNOSIS — I2510 Atherosclerotic heart disease of native coronary artery without angina pectoris: Secondary | ICD-10-CM | POA: Diagnosis not present

## 2015-12-28 DIAGNOSIS — I132 Hypertensive heart and chronic kidney disease with heart failure and with stage 5 chronic kidney disease, or end stage renal disease: Secondary | ICD-10-CM | POA: Diagnosis not present

## 2016-01-03 DIAGNOSIS — I2510 Atherosclerotic heart disease of native coronary artery without angina pectoris: Secondary | ICD-10-CM | POA: Diagnosis not present

## 2016-01-08 DIAGNOSIS — I480 Paroxysmal atrial fibrillation: Secondary | ICD-10-CM | POA: Diagnosis not present

## 2016-01-08 DIAGNOSIS — I25110 Atherosclerotic heart disease of native coronary artery with unstable angina pectoris: Secondary | ICD-10-CM | POA: Diagnosis not present

## 2016-01-08 DIAGNOSIS — I5042 Chronic combined systolic (congestive) and diastolic (congestive) heart failure: Secondary | ICD-10-CM

## 2016-01-08 DIAGNOSIS — I10 Essential (primary) hypertension: Secondary | ICD-10-CM | POA: Diagnosis not present

## 2016-01-08 DIAGNOSIS — N183 Chronic kidney disease, stage 3 (moderate): Secondary | ICD-10-CM | POA: Diagnosis not present

## 2016-01-08 DIAGNOSIS — E785 Hyperlipidemia, unspecified: Secondary | ICD-10-CM | POA: Diagnosis not present

## 2016-01-08 HISTORY — DX: Chronic combined systolic (congestive) and diastolic (congestive) heart failure: I50.42

## 2016-01-09 DIAGNOSIS — Z1211 Encounter for screening for malignant neoplasm of colon: Secondary | ICD-10-CM | POA: Diagnosis not present

## 2016-01-09 DIAGNOSIS — R1010 Upper abdominal pain, unspecified: Secondary | ICD-10-CM | POA: Diagnosis not present

## 2016-01-09 DIAGNOSIS — K7460 Unspecified cirrhosis of liver: Secondary | ICD-10-CM | POA: Diagnosis not present

## 2016-01-09 DIAGNOSIS — N289 Disorder of kidney and ureter, unspecified: Secondary | ICD-10-CM | POA: Diagnosis not present

## 2016-01-22 DIAGNOSIS — N183 Chronic kidney disease, stage 3 (moderate): Secondary | ICD-10-CM | POA: Diagnosis not present

## 2016-01-22 DIAGNOSIS — I480 Paroxysmal atrial fibrillation: Secondary | ICD-10-CM | POA: Diagnosis not present

## 2016-01-22 DIAGNOSIS — I25110 Atherosclerotic heart disease of native coronary artery with unstable angina pectoris: Secondary | ICD-10-CM | POA: Diagnosis not present

## 2016-01-22 DIAGNOSIS — I10 Essential (primary) hypertension: Secondary | ICD-10-CM | POA: Diagnosis not present

## 2016-01-22 DIAGNOSIS — E785 Hyperlipidemia, unspecified: Secondary | ICD-10-CM | POA: Diagnosis not present

## 2016-01-22 DIAGNOSIS — I5042 Chronic combined systolic (congestive) and diastolic (congestive) heart failure: Secondary | ICD-10-CM | POA: Diagnosis not present

## 2016-02-03 DIAGNOSIS — S0181XA Laceration without foreign body of other part of head, initial encounter: Secondary | ICD-10-CM | POA: Diagnosis not present

## 2016-02-03 DIAGNOSIS — S022XXA Fracture of nasal bones, initial encounter for closed fracture: Secondary | ICD-10-CM | POA: Diagnosis not present

## 2016-02-03 DIAGNOSIS — R040 Epistaxis: Secondary | ICD-10-CM | POA: Diagnosis not present

## 2016-02-03 DIAGNOSIS — S4991XA Unspecified injury of right shoulder and upper arm, initial encounter: Secondary | ICD-10-CM | POA: Diagnosis not present

## 2016-02-03 DIAGNOSIS — S0121XA Laceration without foreign body of nose, initial encounter: Secondary | ICD-10-CM | POA: Diagnosis not present

## 2016-02-03 DIAGNOSIS — S59901A Unspecified injury of right elbow, initial encounter: Secondary | ICD-10-CM | POA: Diagnosis not present

## 2016-02-04 DIAGNOSIS — S0181XA Laceration without foreign body of other part of head, initial encounter: Secondary | ICD-10-CM | POA: Diagnosis not present

## 2016-02-04 DIAGNOSIS — M25521 Pain in right elbow: Secondary | ICD-10-CM | POA: Diagnosis not present

## 2016-02-04 DIAGNOSIS — S4991XA Unspecified injury of right shoulder and upper arm, initial encounter: Secondary | ICD-10-CM | POA: Diagnosis not present

## 2016-02-06 DIAGNOSIS — I2510 Atherosclerotic heart disease of native coronary artery without angina pectoris: Secondary | ICD-10-CM | POA: Diagnosis not present

## 2016-02-06 DIAGNOSIS — S022XXA Fracture of nasal bones, initial encounter for closed fracture: Secondary | ICD-10-CM | POA: Diagnosis not present

## 2016-02-06 DIAGNOSIS — Z6832 Body mass index (BMI) 32.0-32.9, adult: Secondary | ICD-10-CM | POA: Diagnosis not present

## 2016-02-06 DIAGNOSIS — M79603 Pain in arm, unspecified: Secondary | ICD-10-CM | POA: Diagnosis not present

## 2016-02-08 DIAGNOSIS — R040 Epistaxis: Secondary | ICD-10-CM | POA: Diagnosis not present

## 2016-02-08 DIAGNOSIS — Z4802 Encounter for removal of sutures: Secondary | ICD-10-CM | POA: Diagnosis not present

## 2016-02-11 DIAGNOSIS — S63501A Unspecified sprain of right wrist, initial encounter: Secondary | ICD-10-CM | POA: Diagnosis not present

## 2016-02-11 DIAGNOSIS — M25521 Pain in right elbow: Secondary | ICD-10-CM | POA: Diagnosis not present

## 2016-02-11 DIAGNOSIS — S022XXA Fracture of nasal bones, initial encounter for closed fracture: Secondary | ICD-10-CM | POA: Diagnosis not present

## 2016-02-12 DIAGNOSIS — M109 Gout, unspecified: Secondary | ICD-10-CM | POA: Diagnosis not present

## 2016-02-12 DIAGNOSIS — H6692 Otitis media, unspecified, left ear: Secondary | ICD-10-CM | POA: Diagnosis not present

## 2016-02-12 DIAGNOSIS — Z6832 Body mass index (BMI) 32.0-32.9, adult: Secondary | ICD-10-CM | POA: Diagnosis not present

## 2016-02-19 DIAGNOSIS — I2510 Atherosclerotic heart disease of native coronary artery without angina pectoris: Secondary | ICD-10-CM | POA: Diagnosis not present

## 2016-02-20 DIAGNOSIS — M9904 Segmental and somatic dysfunction of sacral region: Secondary | ICD-10-CM | POA: Diagnosis not present

## 2016-02-20 DIAGNOSIS — Z6832 Body mass index (BMI) 32.0-32.9, adult: Secondary | ICD-10-CM | POA: Diagnosis not present

## 2016-02-20 DIAGNOSIS — M9903 Segmental and somatic dysfunction of lumbar region: Secondary | ICD-10-CM | POA: Diagnosis not present

## 2016-02-20 DIAGNOSIS — M5136 Other intervertebral disc degeneration, lumbar region: Secondary | ICD-10-CM | POA: Diagnosis not present

## 2016-02-20 DIAGNOSIS — M5137 Other intervertebral disc degeneration, lumbosacral region: Secondary | ICD-10-CM | POA: Diagnosis not present

## 2016-02-20 DIAGNOSIS — M5442 Lumbago with sciatica, left side: Secondary | ICD-10-CM | POA: Diagnosis not present

## 2016-02-20 DIAGNOSIS — M5413 Radiculopathy, cervicothoracic region: Secondary | ICD-10-CM | POA: Diagnosis not present

## 2016-02-20 DIAGNOSIS — R062 Wheezing: Secondary | ICD-10-CM | POA: Diagnosis not present

## 2016-02-24 DIAGNOSIS — M9903 Segmental and somatic dysfunction of lumbar region: Secondary | ICD-10-CM | POA: Diagnosis not present

## 2016-02-24 DIAGNOSIS — M5442 Lumbago with sciatica, left side: Secondary | ICD-10-CM | POA: Diagnosis not present

## 2016-02-24 DIAGNOSIS — M5413 Radiculopathy, cervicothoracic region: Secondary | ICD-10-CM | POA: Diagnosis not present

## 2016-02-24 DIAGNOSIS — M5137 Other intervertebral disc degeneration, lumbosacral region: Secondary | ICD-10-CM | POA: Diagnosis not present

## 2016-02-24 DIAGNOSIS — M9904 Segmental and somatic dysfunction of sacral region: Secondary | ICD-10-CM | POA: Diagnosis not present

## 2016-02-24 DIAGNOSIS — M5136 Other intervertebral disc degeneration, lumbar region: Secondary | ICD-10-CM | POA: Diagnosis not present

## 2016-02-25 DIAGNOSIS — S63501A Unspecified sprain of right wrist, initial encounter: Secondary | ICD-10-CM | POA: Diagnosis not present

## 2016-02-25 DIAGNOSIS — Z6832 Body mass index (BMI) 32.0-32.9, adult: Secondary | ICD-10-CM | POA: Diagnosis not present

## 2016-02-25 DIAGNOSIS — R05 Cough: Secondary | ICD-10-CM | POA: Diagnosis not present

## 2016-02-25 DIAGNOSIS — J449 Chronic obstructive pulmonary disease, unspecified: Secondary | ICD-10-CM | POA: Diagnosis not present

## 2016-02-26 DIAGNOSIS — M5137 Other intervertebral disc degeneration, lumbosacral region: Secondary | ICD-10-CM | POA: Diagnosis not present

## 2016-02-26 DIAGNOSIS — M9903 Segmental and somatic dysfunction of lumbar region: Secondary | ICD-10-CM | POA: Diagnosis not present

## 2016-02-26 DIAGNOSIS — M9904 Segmental and somatic dysfunction of sacral region: Secondary | ICD-10-CM | POA: Diagnosis not present

## 2016-02-26 DIAGNOSIS — M5442 Lumbago with sciatica, left side: Secondary | ICD-10-CM | POA: Diagnosis not present

## 2016-02-26 DIAGNOSIS — M5136 Other intervertebral disc degeneration, lumbar region: Secondary | ICD-10-CM | POA: Diagnosis not present

## 2016-02-26 DIAGNOSIS — M5413 Radiculopathy, cervicothoracic region: Secondary | ICD-10-CM | POA: Diagnosis not present

## 2016-02-27 DIAGNOSIS — M5136 Other intervertebral disc degeneration, lumbar region: Secondary | ICD-10-CM | POA: Diagnosis not present

## 2016-02-27 DIAGNOSIS — M5137 Other intervertebral disc degeneration, lumbosacral region: Secondary | ICD-10-CM | POA: Diagnosis not present

## 2016-02-27 DIAGNOSIS — M9904 Segmental and somatic dysfunction of sacral region: Secondary | ICD-10-CM | POA: Diagnosis not present

## 2016-02-27 DIAGNOSIS — M9903 Segmental and somatic dysfunction of lumbar region: Secondary | ICD-10-CM | POA: Diagnosis not present

## 2016-02-27 DIAGNOSIS — M5442 Lumbago with sciatica, left side: Secondary | ICD-10-CM | POA: Diagnosis not present

## 2016-02-27 DIAGNOSIS — M5413 Radiculopathy, cervicothoracic region: Secondary | ICD-10-CM | POA: Diagnosis not present

## 2016-03-02 DIAGNOSIS — M9904 Segmental and somatic dysfunction of sacral region: Secondary | ICD-10-CM | POA: Diagnosis not present

## 2016-03-02 DIAGNOSIS — M5413 Radiculopathy, cervicothoracic region: Secondary | ICD-10-CM | POA: Diagnosis not present

## 2016-03-02 DIAGNOSIS — M5136 Other intervertebral disc degeneration, lumbar region: Secondary | ICD-10-CM | POA: Diagnosis not present

## 2016-03-02 DIAGNOSIS — M9903 Segmental and somatic dysfunction of lumbar region: Secondary | ICD-10-CM | POA: Diagnosis not present

## 2016-03-02 DIAGNOSIS — M5442 Lumbago with sciatica, left side: Secondary | ICD-10-CM | POA: Diagnosis not present

## 2016-03-02 DIAGNOSIS — M5137 Other intervertebral disc degeneration, lumbosacral region: Secondary | ICD-10-CM | POA: Diagnosis not present

## 2016-03-04 DIAGNOSIS — M5136 Other intervertebral disc degeneration, lumbar region: Secondary | ICD-10-CM | POA: Diagnosis not present

## 2016-03-04 DIAGNOSIS — M9904 Segmental and somatic dysfunction of sacral region: Secondary | ICD-10-CM | POA: Diagnosis not present

## 2016-03-04 DIAGNOSIS — M5137 Other intervertebral disc degeneration, lumbosacral region: Secondary | ICD-10-CM | POA: Diagnosis not present

## 2016-03-04 DIAGNOSIS — M5442 Lumbago with sciatica, left side: Secondary | ICD-10-CM | POA: Diagnosis not present

## 2016-03-04 DIAGNOSIS — M5413 Radiculopathy, cervicothoracic region: Secondary | ICD-10-CM | POA: Diagnosis not present

## 2016-03-04 DIAGNOSIS — M9903 Segmental and somatic dysfunction of lumbar region: Secondary | ICD-10-CM | POA: Diagnosis not present

## 2016-03-05 DIAGNOSIS — M5442 Lumbago with sciatica, left side: Secondary | ICD-10-CM | POA: Diagnosis not present

## 2016-03-05 DIAGNOSIS — M5413 Radiculopathy, cervicothoracic region: Secondary | ICD-10-CM | POA: Diagnosis not present

## 2016-03-05 DIAGNOSIS — M9904 Segmental and somatic dysfunction of sacral region: Secondary | ICD-10-CM | POA: Diagnosis not present

## 2016-03-05 DIAGNOSIS — M5136 Other intervertebral disc degeneration, lumbar region: Secondary | ICD-10-CM | POA: Diagnosis not present

## 2016-03-05 DIAGNOSIS — M9903 Segmental and somatic dysfunction of lumbar region: Secondary | ICD-10-CM | POA: Diagnosis not present

## 2016-03-05 DIAGNOSIS — M5137 Other intervertebral disc degeneration, lumbosacral region: Secondary | ICD-10-CM | POA: Diagnosis not present

## 2016-03-10 DIAGNOSIS — E785 Hyperlipidemia, unspecified: Secondary | ICD-10-CM | POA: Diagnosis not present

## 2016-03-10 DIAGNOSIS — I129 Hypertensive chronic kidney disease with stage 1 through stage 4 chronic kidney disease, or unspecified chronic kidney disease: Secondary | ICD-10-CM | POA: Diagnosis not present

## 2016-03-10 DIAGNOSIS — N184 Chronic kidney disease, stage 4 (severe): Secondary | ICD-10-CM | POA: Diagnosis not present

## 2016-03-10 DIAGNOSIS — M9903 Segmental and somatic dysfunction of lumbar region: Secondary | ICD-10-CM | POA: Diagnosis not present

## 2016-03-10 DIAGNOSIS — M5413 Radiculopathy, cervicothoracic region: Secondary | ICD-10-CM | POA: Diagnosis not present

## 2016-03-10 DIAGNOSIS — M9904 Segmental and somatic dysfunction of sacral region: Secondary | ICD-10-CM | POA: Diagnosis not present

## 2016-03-10 DIAGNOSIS — E1169 Type 2 diabetes mellitus with other specified complication: Secondary | ICD-10-CM | POA: Diagnosis not present

## 2016-03-10 DIAGNOSIS — M5136 Other intervertebral disc degeneration, lumbar region: Secondary | ICD-10-CM | POA: Diagnosis not present

## 2016-03-10 DIAGNOSIS — M5442 Lumbago with sciatica, left side: Secondary | ICD-10-CM | POA: Diagnosis not present

## 2016-03-10 DIAGNOSIS — M5137 Other intervertebral disc degeneration, lumbosacral region: Secondary | ICD-10-CM | POA: Diagnosis not present

## 2016-03-10 DIAGNOSIS — Z125 Encounter for screening for malignant neoplasm of prostate: Secondary | ICD-10-CM | POA: Diagnosis not present

## 2016-03-11 DIAGNOSIS — M9903 Segmental and somatic dysfunction of lumbar region: Secondary | ICD-10-CM | POA: Diagnosis not present

## 2016-03-11 DIAGNOSIS — M5413 Radiculopathy, cervicothoracic region: Secondary | ICD-10-CM | POA: Diagnosis not present

## 2016-03-11 DIAGNOSIS — M5137 Other intervertebral disc degeneration, lumbosacral region: Secondary | ICD-10-CM | POA: Diagnosis not present

## 2016-03-11 DIAGNOSIS — M9904 Segmental and somatic dysfunction of sacral region: Secondary | ICD-10-CM | POA: Diagnosis not present

## 2016-03-11 DIAGNOSIS — M5442 Lumbago with sciatica, left side: Secondary | ICD-10-CM | POA: Diagnosis not present

## 2016-03-11 DIAGNOSIS — M5136 Other intervertebral disc degeneration, lumbar region: Secondary | ICD-10-CM | POA: Diagnosis not present

## 2016-03-12 DIAGNOSIS — M9903 Segmental and somatic dysfunction of lumbar region: Secondary | ICD-10-CM | POA: Diagnosis not present

## 2016-03-12 DIAGNOSIS — M5136 Other intervertebral disc degeneration, lumbar region: Secondary | ICD-10-CM | POA: Diagnosis not present

## 2016-03-12 DIAGNOSIS — M9904 Segmental and somatic dysfunction of sacral region: Secondary | ICD-10-CM | POA: Diagnosis not present

## 2016-03-12 DIAGNOSIS — M5442 Lumbago with sciatica, left side: Secondary | ICD-10-CM | POA: Diagnosis not present

## 2016-03-12 DIAGNOSIS — M5137 Other intervertebral disc degeneration, lumbosacral region: Secondary | ICD-10-CM | POA: Diagnosis not present

## 2016-03-12 DIAGNOSIS — M5413 Radiculopathy, cervicothoracic region: Secondary | ICD-10-CM | POA: Diagnosis not present

## 2016-03-18 DIAGNOSIS — M5137 Other intervertebral disc degeneration, lumbosacral region: Secondary | ICD-10-CM | POA: Diagnosis not present

## 2016-03-18 DIAGNOSIS — M9904 Segmental and somatic dysfunction of sacral region: Secondary | ICD-10-CM | POA: Diagnosis not present

## 2016-03-18 DIAGNOSIS — M9903 Segmental and somatic dysfunction of lumbar region: Secondary | ICD-10-CM | POA: Diagnosis not present

## 2016-03-18 DIAGNOSIS — M5136 Other intervertebral disc degeneration, lumbar region: Secondary | ICD-10-CM | POA: Diagnosis not present

## 2016-03-18 DIAGNOSIS — M5442 Lumbago with sciatica, left side: Secondary | ICD-10-CM | POA: Diagnosis not present

## 2016-03-18 DIAGNOSIS — M5413 Radiculopathy, cervicothoracic region: Secondary | ICD-10-CM | POA: Diagnosis not present

## 2016-03-19 DIAGNOSIS — Z6833 Body mass index (BMI) 33.0-33.9, adult: Secondary | ICD-10-CM | POA: Diagnosis not present

## 2016-03-19 DIAGNOSIS — M5137 Other intervertebral disc degeneration, lumbosacral region: Secondary | ICD-10-CM | POA: Diagnosis not present

## 2016-03-19 DIAGNOSIS — M9903 Segmental and somatic dysfunction of lumbar region: Secondary | ICD-10-CM | POA: Diagnosis not present

## 2016-03-19 DIAGNOSIS — M9904 Segmental and somatic dysfunction of sacral region: Secondary | ICD-10-CM | POA: Diagnosis not present

## 2016-03-19 DIAGNOSIS — M5413 Radiculopathy, cervicothoracic region: Secondary | ICD-10-CM | POA: Diagnosis not present

## 2016-03-19 DIAGNOSIS — J449 Chronic obstructive pulmonary disease, unspecified: Secondary | ICD-10-CM | POA: Diagnosis not present

## 2016-03-19 DIAGNOSIS — M5136 Other intervertebral disc degeneration, lumbar region: Secondary | ICD-10-CM | POA: Diagnosis not present

## 2016-03-19 DIAGNOSIS — M5442 Lumbago with sciatica, left side: Secondary | ICD-10-CM | POA: Diagnosis not present

## 2016-03-23 DIAGNOSIS — M9903 Segmental and somatic dysfunction of lumbar region: Secondary | ICD-10-CM | POA: Diagnosis not present

## 2016-03-23 DIAGNOSIS — M5137 Other intervertebral disc degeneration, lumbosacral region: Secondary | ICD-10-CM | POA: Diagnosis not present

## 2016-03-23 DIAGNOSIS — M5442 Lumbago with sciatica, left side: Secondary | ICD-10-CM | POA: Diagnosis not present

## 2016-03-23 DIAGNOSIS — M9904 Segmental and somatic dysfunction of sacral region: Secondary | ICD-10-CM | POA: Diagnosis not present

## 2016-03-23 DIAGNOSIS — M5136 Other intervertebral disc degeneration, lumbar region: Secondary | ICD-10-CM | POA: Diagnosis not present

## 2016-03-23 DIAGNOSIS — M5413 Radiculopathy, cervicothoracic region: Secondary | ICD-10-CM | POA: Diagnosis not present

## 2016-03-25 DIAGNOSIS — M5136 Other intervertebral disc degeneration, lumbar region: Secondary | ICD-10-CM | POA: Diagnosis not present

## 2016-03-25 DIAGNOSIS — M5137 Other intervertebral disc degeneration, lumbosacral region: Secondary | ICD-10-CM | POA: Diagnosis not present

## 2016-03-25 DIAGNOSIS — M9904 Segmental and somatic dysfunction of sacral region: Secondary | ICD-10-CM | POA: Diagnosis not present

## 2016-03-25 DIAGNOSIS — M5413 Radiculopathy, cervicothoracic region: Secondary | ICD-10-CM | POA: Diagnosis not present

## 2016-03-25 DIAGNOSIS — M9903 Segmental and somatic dysfunction of lumbar region: Secondary | ICD-10-CM | POA: Diagnosis not present

## 2016-03-25 DIAGNOSIS — M5442 Lumbago with sciatica, left side: Secondary | ICD-10-CM | POA: Diagnosis not present

## 2016-03-26 DIAGNOSIS — M5137 Other intervertebral disc degeneration, lumbosacral region: Secondary | ICD-10-CM | POA: Diagnosis not present

## 2016-03-26 DIAGNOSIS — M9904 Segmental and somatic dysfunction of sacral region: Secondary | ICD-10-CM | POA: Diagnosis not present

## 2016-03-26 DIAGNOSIS — M9903 Segmental and somatic dysfunction of lumbar region: Secondary | ICD-10-CM | POA: Diagnosis not present

## 2016-03-26 DIAGNOSIS — M5136 Other intervertebral disc degeneration, lumbar region: Secondary | ICD-10-CM | POA: Diagnosis not present

## 2016-03-26 DIAGNOSIS — M5442 Lumbago with sciatica, left side: Secondary | ICD-10-CM | POA: Diagnosis not present

## 2016-03-26 DIAGNOSIS — M5413 Radiculopathy, cervicothoracic region: Secondary | ICD-10-CM | POA: Diagnosis not present

## 2016-04-01 DIAGNOSIS — E785 Hyperlipidemia, unspecified: Secondary | ICD-10-CM | POA: Diagnosis not present

## 2016-04-01 DIAGNOSIS — I129 Hypertensive chronic kidney disease with stage 1 through stage 4 chronic kidney disease, or unspecified chronic kidney disease: Secondary | ICD-10-CM | POA: Diagnosis not present

## 2016-04-01 DIAGNOSIS — N184 Chronic kidney disease, stage 4 (severe): Secondary | ICD-10-CM | POA: Diagnosis not present

## 2016-04-01 DIAGNOSIS — E1169 Type 2 diabetes mellitus with other specified complication: Secondary | ICD-10-CM | POA: Diagnosis not present

## 2016-04-09 DIAGNOSIS — I25110 Atherosclerotic heart disease of native coronary artery with unstable angina pectoris: Secondary | ICD-10-CM | POA: Diagnosis not present

## 2016-04-09 DIAGNOSIS — I10 Essential (primary) hypertension: Secondary | ICD-10-CM | POA: Diagnosis not present

## 2016-04-09 DIAGNOSIS — N183 Chronic kidney disease, stage 3 (moderate): Secondary | ICD-10-CM | POA: Diagnosis not present

## 2016-04-09 DIAGNOSIS — E785 Hyperlipidemia, unspecified: Secondary | ICD-10-CM | POA: Diagnosis not present

## 2016-04-09 DIAGNOSIS — I480 Paroxysmal atrial fibrillation: Secondary | ICD-10-CM | POA: Diagnosis not present

## 2016-04-11 DIAGNOSIS — D696 Thrombocytopenia, unspecified: Secondary | ICD-10-CM | POA: Diagnosis not present

## 2016-04-11 DIAGNOSIS — R079 Chest pain, unspecified: Secondary | ICD-10-CM | POA: Diagnosis not present

## 2016-04-11 DIAGNOSIS — E876 Hypokalemia: Secondary | ICD-10-CM | POA: Diagnosis not present

## 2016-04-11 DIAGNOSIS — I509 Heart failure, unspecified: Secondary | ICD-10-CM | POA: Diagnosis not present

## 2016-04-11 DIAGNOSIS — I483 Typical atrial flutter: Secondary | ICD-10-CM | POA: Diagnosis not present

## 2016-04-11 DIAGNOSIS — I4892 Unspecified atrial flutter: Secondary | ICD-10-CM | POA: Diagnosis not present

## 2016-04-11 DIAGNOSIS — I10 Essential (primary) hypertension: Secondary | ICD-10-CM | POA: Diagnosis not present

## 2016-04-11 DIAGNOSIS — I2510 Atherosclerotic heart disease of native coronary artery without angina pectoris: Secondary | ICD-10-CM | POA: Diagnosis not present

## 2016-04-11 DIAGNOSIS — N189 Chronic kidney disease, unspecified: Secondary | ICD-10-CM | POA: Diagnosis not present

## 2016-04-11 DIAGNOSIS — I130 Hypertensive heart and chronic kidney disease with heart failure and stage 1 through stage 4 chronic kidney disease, or unspecified chronic kidney disease: Secondary | ICD-10-CM | POA: Diagnosis not present

## 2016-04-11 DIAGNOSIS — N184 Chronic kidney disease, stage 4 (severe): Secondary | ICD-10-CM | POA: Diagnosis not present

## 2016-04-11 DIAGNOSIS — R072 Precordial pain: Secondary | ICD-10-CM | POA: Diagnosis not present

## 2016-04-11 DIAGNOSIS — Z0289 Encounter for other administrative examinations: Secondary | ICD-10-CM | POA: Diagnosis not present

## 2016-04-12 DIAGNOSIS — I480 Paroxysmal atrial fibrillation: Secondary | ICD-10-CM | POA: Diagnosis not present

## 2016-04-12 DIAGNOSIS — I4891 Unspecified atrial fibrillation: Secondary | ICD-10-CM | POA: Diagnosis not present

## 2016-04-12 DIAGNOSIS — I4892 Unspecified atrial flutter: Secondary | ICD-10-CM | POA: Diagnosis not present

## 2016-04-12 DIAGNOSIS — N179 Acute kidney failure, unspecified: Secondary | ICD-10-CM | POA: Diagnosis not present

## 2016-04-12 DIAGNOSIS — R55 Syncope and collapse: Secondary | ICD-10-CM | POA: Diagnosis not present

## 2016-04-12 DIAGNOSIS — I2510 Atherosclerotic heart disease of native coronary artery without angina pectoris: Secondary | ICD-10-CM | POA: Diagnosis not present

## 2016-04-12 DIAGNOSIS — I10 Essential (primary) hypertension: Secondary | ICD-10-CM | POA: Diagnosis not present

## 2016-04-13 DIAGNOSIS — R739 Hyperglycemia, unspecified: Secondary | ICD-10-CM | POA: Diagnosis present

## 2016-04-13 DIAGNOSIS — R079 Chest pain, unspecified: Secondary | ICD-10-CM | POA: Diagnosis not present

## 2016-04-13 DIAGNOSIS — Z7902 Long term (current) use of antithrombotics/antiplatelets: Secondary | ICD-10-CM | POA: Diagnosis not present

## 2016-04-13 DIAGNOSIS — N184 Chronic kidney disease, stage 4 (severe): Secondary | ICD-10-CM | POA: Diagnosis present

## 2016-04-13 DIAGNOSIS — E876 Hypokalemia: Secondary | ICD-10-CM | POA: Diagnosis present

## 2016-04-13 DIAGNOSIS — I10 Essential (primary) hypertension: Secondary | ICD-10-CM | POA: Diagnosis not present

## 2016-04-13 DIAGNOSIS — I4891 Unspecified atrial fibrillation: Secondary | ICD-10-CM | POA: Diagnosis not present

## 2016-04-13 DIAGNOSIS — D696 Thrombocytopenia, unspecified: Secondary | ICD-10-CM | POA: Diagnosis present

## 2016-04-13 DIAGNOSIS — Z955 Presence of coronary angioplasty implant and graft: Secondary | ICD-10-CM | POA: Diagnosis not present

## 2016-04-13 DIAGNOSIS — I130 Hypertensive heart and chronic kidney disease with heart failure and stage 1 through stage 4 chronic kidney disease, or unspecified chronic kidney disease: Secondary | ICD-10-CM | POA: Diagnosis present

## 2016-04-13 DIAGNOSIS — I509 Heart failure, unspecified: Secondary | ICD-10-CM | POA: Diagnosis present

## 2016-04-13 DIAGNOSIS — E785 Hyperlipidemia, unspecified: Secondary | ICD-10-CM | POA: Diagnosis present

## 2016-04-13 DIAGNOSIS — R55 Syncope and collapse: Secondary | ICD-10-CM | POA: Diagnosis not present

## 2016-04-13 DIAGNOSIS — I4892 Unspecified atrial flutter: Secondary | ICD-10-CM | POA: Diagnosis present

## 2016-04-13 DIAGNOSIS — F17210 Nicotine dependence, cigarettes, uncomplicated: Secondary | ICD-10-CM | POA: Diagnosis present

## 2016-04-13 DIAGNOSIS — I2510 Atherosclerotic heart disease of native coronary artery without angina pectoris: Secondary | ICD-10-CM | POA: Diagnosis present

## 2016-04-13 DIAGNOSIS — N179 Acute kidney failure, unspecified: Secondary | ICD-10-CM | POA: Diagnosis not present

## 2016-04-13 DIAGNOSIS — D649 Anemia, unspecified: Secondary | ICD-10-CM | POA: Diagnosis present

## 2016-04-13 DIAGNOSIS — I252 Old myocardial infarction: Secondary | ICD-10-CM | POA: Diagnosis not present

## 2016-04-16 DIAGNOSIS — I483 Typical atrial flutter: Secondary | ICD-10-CM | POA: Diagnosis not present

## 2016-04-16 DIAGNOSIS — I4892 Unspecified atrial flutter: Secondary | ICD-10-CM | POA: Insufficient documentation

## 2016-04-16 HISTORY — DX: Unspecified atrial flutter: I48.92

## 2016-04-17 DIAGNOSIS — N184 Chronic kidney disease, stage 4 (severe): Secondary | ICD-10-CM | POA: Diagnosis not present

## 2016-04-17 DIAGNOSIS — I2510 Atherosclerotic heart disease of native coronary artery without angina pectoris: Secondary | ICD-10-CM | POA: Diagnosis not present

## 2016-04-17 DIAGNOSIS — I4892 Unspecified atrial flutter: Secondary | ICD-10-CM | POA: Diagnosis not present

## 2016-04-17 DIAGNOSIS — E785 Hyperlipidemia, unspecified: Secondary | ICD-10-CM | POA: Diagnosis not present

## 2016-04-17 DIAGNOSIS — I483 Typical atrial flutter: Secondary | ICD-10-CM | POA: Diagnosis not present

## 2016-04-17 DIAGNOSIS — Z7902 Long term (current) use of antithrombotics/antiplatelets: Secondary | ICD-10-CM | POA: Diagnosis not present

## 2016-04-17 DIAGNOSIS — Z955 Presence of coronary angioplasty implant and graft: Secondary | ICD-10-CM | POA: Diagnosis not present

## 2016-04-17 DIAGNOSIS — K219 Gastro-esophageal reflux disease without esophagitis: Secondary | ICD-10-CM | POA: Diagnosis not present

## 2016-04-17 DIAGNOSIS — I509 Heart failure, unspecified: Secondary | ICD-10-CM | POA: Diagnosis not present

## 2016-04-17 DIAGNOSIS — I480 Paroxysmal atrial fibrillation: Secondary | ICD-10-CM | POA: Diagnosis not present

## 2016-04-17 DIAGNOSIS — G4730 Sleep apnea, unspecified: Secondary | ICD-10-CM | POA: Diagnosis not present

## 2016-04-17 DIAGNOSIS — I272 Pulmonary hypertension, unspecified: Secondary | ICD-10-CM | POA: Diagnosis not present

## 2016-04-17 DIAGNOSIS — I130 Hypertensive heart and chronic kidney disease with heart failure and stage 1 through stage 4 chronic kidney disease, or unspecified chronic kidney disease: Secondary | ICD-10-CM | POA: Diagnosis not present

## 2016-04-17 DIAGNOSIS — F17210 Nicotine dependence, cigarettes, uncomplicated: Secondary | ICD-10-CM | POA: Diagnosis not present

## 2016-04-28 DIAGNOSIS — M10031 Idiopathic gout, right wrist: Secondary | ICD-10-CM | POA: Diagnosis not present

## 2016-04-30 DIAGNOSIS — I481 Persistent atrial fibrillation: Secondary | ICD-10-CM | POA: Diagnosis not present

## 2016-04-30 DIAGNOSIS — M1A9XX0 Chronic gout, unspecified, without tophus (tophi): Secondary | ICD-10-CM | POA: Diagnosis present

## 2016-04-30 DIAGNOSIS — N184 Chronic kidney disease, stage 4 (severe): Secondary | ICD-10-CM | POA: Diagnosis not present

## 2016-04-30 DIAGNOSIS — G4733 Obstructive sleep apnea (adult) (pediatric): Secondary | ICD-10-CM

## 2016-04-30 DIAGNOSIS — I5032 Chronic diastolic (congestive) heart failure: Secondary | ICD-10-CM | POA: Diagnosis present

## 2016-04-30 DIAGNOSIS — I2729 Other secondary pulmonary hypertension: Secondary | ICD-10-CM | POA: Diagnosis not present

## 2016-04-30 DIAGNOSIS — I509 Heart failure, unspecified: Secondary | ICD-10-CM | POA: Diagnosis not present

## 2016-04-30 DIAGNOSIS — R079 Chest pain, unspecified: Secondary | ICD-10-CM | POA: Diagnosis not present

## 2016-04-30 DIAGNOSIS — I2510 Atherosclerotic heart disease of native coronary artery without angina pectoris: Secondary | ICD-10-CM | POA: Diagnosis not present

## 2016-04-30 DIAGNOSIS — M109 Gout, unspecified: Secondary | ICD-10-CM

## 2016-04-30 DIAGNOSIS — I480 Paroxysmal atrial fibrillation: Secondary | ICD-10-CM | POA: Diagnosis not present

## 2016-04-30 DIAGNOSIS — I4460 Unspecified fascicular block: Secondary | ICD-10-CM

## 2016-04-30 DIAGNOSIS — N189 Chronic kidney disease, unspecified: Secondary | ICD-10-CM | POA: Diagnosis not present

## 2016-04-30 DIAGNOSIS — Z23 Encounter for immunization: Secondary | ICD-10-CM | POA: Diagnosis not present

## 2016-04-30 DIAGNOSIS — F17210 Nicotine dependence, cigarettes, uncomplicated: Secondary | ICD-10-CM | POA: Diagnosis not present

## 2016-04-30 DIAGNOSIS — I252 Old myocardial infarction: Secondary | ICD-10-CM | POA: Diagnosis not present

## 2016-04-30 DIAGNOSIS — I5030 Unspecified diastolic (congestive) heart failure: Secondary | ICD-10-CM | POA: Diagnosis not present

## 2016-04-30 DIAGNOSIS — Z8249 Family history of ischemic heart disease and other diseases of the circulatory system: Secondary | ICD-10-CM | POA: Diagnosis not present

## 2016-04-30 DIAGNOSIS — Z955 Presence of coronary angioplasty implant and graft: Secondary | ICD-10-CM | POA: Diagnosis not present

## 2016-04-30 DIAGNOSIS — I249 Acute ischemic heart disease, unspecified: Secondary | ICD-10-CM | POA: Diagnosis not present

## 2016-04-30 DIAGNOSIS — I517 Cardiomegaly: Secondary | ICD-10-CM | POA: Diagnosis not present

## 2016-04-30 DIAGNOSIS — I119 Hypertensive heart disease without heart failure: Secondary | ICD-10-CM | POA: Diagnosis not present

## 2016-04-30 DIAGNOSIS — I130 Hypertensive heart and chronic kidney disease with heart failure and stage 1 through stage 4 chronic kidney disease, or unspecified chronic kidney disease: Secondary | ICD-10-CM | POA: Diagnosis not present

## 2016-04-30 DIAGNOSIS — I4891 Unspecified atrial fibrillation: Secondary | ICD-10-CM | POA: Diagnosis not present

## 2016-05-01 DIAGNOSIS — R079 Chest pain, unspecified: Secondary | ICD-10-CM | POA: Diagnosis not present

## 2016-05-07 DIAGNOSIS — N183 Chronic kidney disease, stage 3 (moderate): Secondary | ICD-10-CM | POA: Diagnosis not present

## 2016-05-07 DIAGNOSIS — I10 Essential (primary) hypertension: Secondary | ICD-10-CM | POA: Diagnosis not present

## 2016-05-07 DIAGNOSIS — I4891 Unspecified atrial fibrillation: Secondary | ICD-10-CM | POA: Diagnosis not present

## 2016-05-07 DIAGNOSIS — I25110 Atherosclerotic heart disease of native coronary artery with unstable angina pectoris: Secondary | ICD-10-CM | POA: Diagnosis not present

## 2016-05-07 DIAGNOSIS — I480 Paroxysmal atrial fibrillation: Secondary | ICD-10-CM | POA: Diagnosis not present

## 2016-05-07 DIAGNOSIS — M109 Gout, unspecified: Secondary | ICD-10-CM | POA: Diagnosis not present

## 2016-05-07 DIAGNOSIS — E785 Hyperlipidemia, unspecified: Secondary | ICD-10-CM | POA: Diagnosis not present

## 2016-05-07 DIAGNOSIS — Z6834 Body mass index (BMI) 34.0-34.9, adult: Secondary | ICD-10-CM | POA: Diagnosis not present

## 2016-05-09 DIAGNOSIS — R109 Unspecified abdominal pain: Secondary | ICD-10-CM | POA: Diagnosis not present

## 2016-05-09 DIAGNOSIS — R079 Chest pain, unspecified: Secondary | ICD-10-CM | POA: Diagnosis not present

## 2016-05-09 DIAGNOSIS — G4733 Obstructive sleep apnea (adult) (pediatric): Secondary | ICD-10-CM | POA: Diagnosis not present

## 2016-05-09 DIAGNOSIS — R072 Precordial pain: Secondary | ICD-10-CM | POA: Diagnosis not present

## 2016-05-09 DIAGNOSIS — F17210 Nicotine dependence, cigarettes, uncomplicated: Secondary | ICD-10-CM | POA: Diagnosis not present

## 2016-05-09 DIAGNOSIS — I130 Hypertensive heart and chronic kidney disease with heart failure and stage 1 through stage 4 chronic kidney disease, or unspecified chronic kidney disease: Secondary | ICD-10-CM | POA: Diagnosis not present

## 2016-05-09 DIAGNOSIS — K219 Gastro-esophageal reflux disease without esophagitis: Secondary | ICD-10-CM | POA: Diagnosis not present

## 2016-05-09 DIAGNOSIS — K7460 Unspecified cirrhosis of liver: Secondary | ICD-10-CM | POA: Diagnosis not present

## 2016-05-09 DIAGNOSIS — N184 Chronic kidney disease, stage 4 (severe): Secondary | ICD-10-CM | POA: Diagnosis not present

## 2016-05-09 DIAGNOSIS — R0602 Shortness of breath: Secondary | ICD-10-CM | POA: Diagnosis not present

## 2016-05-09 DIAGNOSIS — I4891 Unspecified atrial fibrillation: Secondary | ICD-10-CM | POA: Diagnosis not present

## 2016-05-09 DIAGNOSIS — I509 Heart failure, unspecified: Secondary | ICD-10-CM | POA: Diagnosis not present

## 2016-05-09 DIAGNOSIS — N189 Chronic kidney disease, unspecified: Secondary | ICD-10-CM | POA: Diagnosis not present

## 2016-05-09 DIAGNOSIS — Z79899 Other long term (current) drug therapy: Secondary | ICD-10-CM | POA: Diagnosis not present

## 2016-05-09 DIAGNOSIS — E1122 Type 2 diabetes mellitus with diabetic chronic kidney disease: Secondary | ICD-10-CM | POA: Diagnosis not present

## 2016-05-09 DIAGNOSIS — I5189 Other ill-defined heart diseases: Secondary | ICD-10-CM | POA: Diagnosis not present

## 2016-05-10 DIAGNOSIS — R079 Chest pain, unspecified: Secondary | ICD-10-CM | POA: Diagnosis not present

## 2016-05-10 DIAGNOSIS — N189 Chronic kidney disease, unspecified: Secondary | ICD-10-CM | POA: Diagnosis not present

## 2016-05-10 DIAGNOSIS — N184 Chronic kidney disease, stage 4 (severe): Secondary | ICD-10-CM | POA: Diagnosis not present

## 2016-05-10 DIAGNOSIS — R109 Unspecified abdominal pain: Secondary | ICD-10-CM | POA: Diagnosis not present

## 2016-05-11 DIAGNOSIS — I10 Essential (primary) hypertension: Secondary | ICD-10-CM | POA: Diagnosis not present

## 2016-05-11 DIAGNOSIS — E785 Hyperlipidemia, unspecified: Secondary | ICD-10-CM | POA: Diagnosis not present

## 2016-05-11 DIAGNOSIS — I25110 Atherosclerotic heart disease of native coronary artery with unstable angina pectoris: Secondary | ICD-10-CM | POA: Diagnosis not present

## 2016-05-11 DIAGNOSIS — N183 Chronic kidney disease, stage 3 (moderate): Secondary | ICD-10-CM | POA: Diagnosis not present

## 2016-05-11 DIAGNOSIS — I480 Paroxysmal atrial fibrillation: Secondary | ICD-10-CM | POA: Diagnosis not present

## 2016-05-13 DIAGNOSIS — E1129 Type 2 diabetes mellitus with other diabetic kidney complication: Secondary | ICD-10-CM | POA: Diagnosis not present

## 2016-05-13 DIAGNOSIS — N185 Chronic kidney disease, stage 5: Secondary | ICD-10-CM | POA: Diagnosis not present

## 2016-05-13 DIAGNOSIS — N2581 Secondary hyperparathyroidism of renal origin: Secondary | ICD-10-CM | POA: Diagnosis not present

## 2016-05-14 DIAGNOSIS — I10 Essential (primary) hypertension: Secondary | ICD-10-CM | POA: Diagnosis not present

## 2016-05-14 DIAGNOSIS — I480 Paroxysmal atrial fibrillation: Secondary | ICD-10-CM | POA: Diagnosis not present

## 2016-05-14 DIAGNOSIS — E785 Hyperlipidemia, unspecified: Secondary | ICD-10-CM | POA: Diagnosis not present

## 2016-05-14 DIAGNOSIS — I25110 Atherosclerotic heart disease of native coronary artery with unstable angina pectoris: Secondary | ICD-10-CM | POA: Diagnosis not present

## 2016-05-18 DIAGNOSIS — Z6833 Body mass index (BMI) 33.0-33.9, adult: Secondary | ICD-10-CM | POA: Diagnosis not present

## 2016-05-18 DIAGNOSIS — I4891 Unspecified atrial fibrillation: Secondary | ICD-10-CM | POA: Diagnosis not present

## 2016-05-18 DIAGNOSIS — F17200 Nicotine dependence, unspecified, uncomplicated: Secondary | ICD-10-CM | POA: Diagnosis not present

## 2016-05-18 DIAGNOSIS — M25531 Pain in right wrist: Secondary | ICD-10-CM | POA: Diagnosis not present

## 2016-05-19 DIAGNOSIS — M25531 Pain in right wrist: Secondary | ICD-10-CM | POA: Diagnosis not present

## 2016-05-19 DIAGNOSIS — M19031 Primary osteoarthritis, right wrist: Secondary | ICD-10-CM | POA: Diagnosis not present

## 2016-05-20 DIAGNOSIS — Z0000 Encounter for general adult medical examination without abnormal findings: Secondary | ICD-10-CM | POA: Diagnosis not present

## 2016-05-20 DIAGNOSIS — Z139 Encounter for screening, unspecified: Secondary | ICD-10-CM | POA: Diagnosis not present

## 2016-06-03 DIAGNOSIS — E785 Hyperlipidemia, unspecified: Secondary | ICD-10-CM | POA: Diagnosis not present

## 2016-06-03 DIAGNOSIS — I10 Essential (primary) hypertension: Secondary | ICD-10-CM | POA: Diagnosis not present

## 2016-06-03 DIAGNOSIS — I25110 Atherosclerotic heart disease of native coronary artery with unstable angina pectoris: Secondary | ICD-10-CM | POA: Diagnosis not present

## 2016-06-03 DIAGNOSIS — N183 Chronic kidney disease, stage 3 (moderate): Secondary | ICD-10-CM | POA: Diagnosis not present

## 2016-06-03 DIAGNOSIS — I5042 Chronic combined systolic (congestive) and diastolic (congestive) heart failure: Secondary | ICD-10-CM | POA: Diagnosis not present

## 2016-06-03 DIAGNOSIS — I481 Persistent atrial fibrillation: Secondary | ICD-10-CM | POA: Diagnosis not present

## 2016-06-08 DIAGNOSIS — M9904 Segmental and somatic dysfunction of sacral region: Secondary | ICD-10-CM | POA: Diagnosis not present

## 2016-06-08 DIAGNOSIS — M9903 Segmental and somatic dysfunction of lumbar region: Secondary | ICD-10-CM | POA: Diagnosis not present

## 2016-06-08 DIAGNOSIS — M5137 Other intervertebral disc degeneration, lumbosacral region: Secondary | ICD-10-CM | POA: Diagnosis not present

## 2016-06-08 DIAGNOSIS — I25110 Atherosclerotic heart disease of native coronary artery with unstable angina pectoris: Secondary | ICD-10-CM | POA: Diagnosis not present

## 2016-06-08 DIAGNOSIS — N183 Chronic kidney disease, stage 3 (moderate): Secondary | ICD-10-CM | POA: Diagnosis not present

## 2016-06-08 DIAGNOSIS — I5042 Chronic combined systolic (congestive) and diastolic (congestive) heart failure: Secondary | ICD-10-CM | POA: Diagnosis not present

## 2016-06-08 DIAGNOSIS — I481 Persistent atrial fibrillation: Secondary | ICD-10-CM | POA: Diagnosis not present

## 2016-06-08 DIAGNOSIS — M5413 Radiculopathy, cervicothoracic region: Secondary | ICD-10-CM | POA: Diagnosis not present

## 2016-06-08 DIAGNOSIS — M5136 Other intervertebral disc degeneration, lumbar region: Secondary | ICD-10-CM | POA: Diagnosis not present

## 2016-06-08 DIAGNOSIS — I10 Essential (primary) hypertension: Secondary | ICD-10-CM | POA: Diagnosis not present

## 2016-06-08 DIAGNOSIS — E785 Hyperlipidemia, unspecified: Secondary | ICD-10-CM | POA: Diagnosis not present

## 2016-06-08 DIAGNOSIS — M5442 Lumbago with sciatica, left side: Secondary | ICD-10-CM | POA: Diagnosis not present

## 2016-06-10 DIAGNOSIS — M9903 Segmental and somatic dysfunction of lumbar region: Secondary | ICD-10-CM | POA: Diagnosis not present

## 2016-06-10 DIAGNOSIS — M5137 Other intervertebral disc degeneration, lumbosacral region: Secondary | ICD-10-CM | POA: Diagnosis not present

## 2016-06-10 DIAGNOSIS — M5442 Lumbago with sciatica, left side: Secondary | ICD-10-CM | POA: Diagnosis not present

## 2016-06-10 DIAGNOSIS — M5413 Radiculopathy, cervicothoracic region: Secondary | ICD-10-CM | POA: Diagnosis not present

## 2016-06-10 DIAGNOSIS — M9904 Segmental and somatic dysfunction of sacral region: Secondary | ICD-10-CM | POA: Diagnosis not present

## 2016-06-10 DIAGNOSIS — M5136 Other intervertebral disc degeneration, lumbar region: Secondary | ICD-10-CM | POA: Diagnosis not present

## 2016-06-12 DIAGNOSIS — E1129 Type 2 diabetes mellitus with other diabetic kidney complication: Secondary | ICD-10-CM | POA: Diagnosis not present

## 2016-06-12 DIAGNOSIS — N2581 Secondary hyperparathyroidism of renal origin: Secondary | ICD-10-CM | POA: Diagnosis not present

## 2016-06-12 DIAGNOSIS — Z6832 Body mass index (BMI) 32.0-32.9, adult: Secondary | ICD-10-CM | POA: Diagnosis not present

## 2016-06-12 DIAGNOSIS — N185 Chronic kidney disease, stage 5: Secondary | ICD-10-CM | POA: Diagnosis not present

## 2016-06-15 DIAGNOSIS — D631 Anemia in chronic kidney disease: Secondary | ICD-10-CM | POA: Diagnosis not present

## 2016-08-03 DIAGNOSIS — I10 Essential (primary) hypertension: Secondary | ICD-10-CM | POA: Diagnosis not present

## 2016-08-03 DIAGNOSIS — I5042 Chronic combined systolic (congestive) and diastolic (congestive) heart failure: Secondary | ICD-10-CM | POA: Diagnosis not present

## 2016-08-03 DIAGNOSIS — E785 Hyperlipidemia, unspecified: Secondary | ICD-10-CM | POA: Diagnosis not present

## 2016-08-03 DIAGNOSIS — I25110 Atherosclerotic heart disease of native coronary artery with unstable angina pectoris: Secondary | ICD-10-CM | POA: Diagnosis not present

## 2016-08-11 DIAGNOSIS — D509 Iron deficiency anemia, unspecified: Secondary | ICD-10-CM | POA: Diagnosis not present

## 2016-08-17 DIAGNOSIS — Z79899 Other long term (current) drug therapy: Secondary | ICD-10-CM | POA: Diagnosis not present

## 2016-08-17 DIAGNOSIS — R0602 Shortness of breath: Secondary | ICD-10-CM | POA: Diagnosis not present

## 2016-08-17 DIAGNOSIS — M10332 Gout due to renal impairment, left wrist: Secondary | ICD-10-CM | POA: Diagnosis not present

## 2016-08-17 DIAGNOSIS — F17210 Nicotine dependence, cigarettes, uncomplicated: Secondary | ICD-10-CM | POA: Diagnosis not present

## 2016-08-17 DIAGNOSIS — E785 Hyperlipidemia, unspecified: Secondary | ICD-10-CM | POA: Diagnosis not present

## 2016-08-17 DIAGNOSIS — M25531 Pain in right wrist: Secondary | ICD-10-CM | POA: Diagnosis not present

## 2016-08-17 DIAGNOSIS — M659 Synovitis and tenosynovitis, unspecified: Secondary | ICD-10-CM | POA: Diagnosis not present

## 2016-08-17 DIAGNOSIS — M65849 Other synovitis and tenosynovitis, unspecified hand: Secondary | ICD-10-CM | POA: Diagnosis not present

## 2016-08-17 DIAGNOSIS — M25539 Pain in unspecified wrist: Secondary | ICD-10-CM | POA: Diagnosis not present

## 2016-08-17 DIAGNOSIS — I120 Hypertensive chronic kidney disease with stage 5 chronic kidney disease or end stage renal disease: Secondary | ICD-10-CM | POA: Diagnosis not present

## 2016-08-17 DIAGNOSIS — Z885 Allergy status to narcotic agent status: Secondary | ICD-10-CM | POA: Diagnosis not present

## 2016-08-17 DIAGNOSIS — M109 Gout, unspecified: Secondary | ICD-10-CM | POA: Diagnosis not present

## 2016-08-17 DIAGNOSIS — Z955 Presence of coronary angioplasty implant and graft: Secondary | ICD-10-CM | POA: Diagnosis not present

## 2016-08-17 DIAGNOSIS — N184 Chronic kidney disease, stage 4 (severe): Secondary | ICD-10-CM | POA: Diagnosis not present

## 2016-08-17 DIAGNOSIS — K219 Gastro-esophageal reflux disease without esophagitis: Secondary | ICD-10-CM | POA: Diagnosis not present

## 2016-08-17 DIAGNOSIS — I2510 Atherosclerotic heart disease of native coronary artery without angina pectoris: Secondary | ICD-10-CM | POA: Diagnosis not present

## 2016-08-18 DIAGNOSIS — D509 Iron deficiency anemia, unspecified: Secondary | ICD-10-CM | POA: Diagnosis not present

## 2016-09-01 DIAGNOSIS — I5042 Chronic combined systolic (congestive) and diastolic (congestive) heart failure: Secondary | ICD-10-CM | POA: Diagnosis not present

## 2016-09-01 DIAGNOSIS — I480 Paroxysmal atrial fibrillation: Secondary | ICD-10-CM | POA: Diagnosis not present

## 2016-09-01 DIAGNOSIS — I10 Essential (primary) hypertension: Secondary | ICD-10-CM | POA: Diagnosis not present

## 2016-09-01 DIAGNOSIS — I25110 Atherosclerotic heart disease of native coronary artery with unstable angina pectoris: Secondary | ICD-10-CM | POA: Diagnosis not present

## 2016-10-02 DIAGNOSIS — D649 Anemia, unspecified: Secondary | ICD-10-CM | POA: Diagnosis not present

## 2016-10-02 DIAGNOSIS — G4733 Obstructive sleep apnea (adult) (pediatric): Secondary | ICD-10-CM | POA: Diagnosis not present

## 2016-10-02 DIAGNOSIS — I252 Old myocardial infarction: Secondary | ICD-10-CM | POA: Diagnosis not present

## 2016-10-02 DIAGNOSIS — N184 Chronic kidney disease, stage 4 (severe): Secondary | ICD-10-CM | POA: Diagnosis not present

## 2016-10-02 DIAGNOSIS — M109 Gout, unspecified: Secondary | ICD-10-CM | POA: Diagnosis not present

## 2016-10-02 DIAGNOSIS — I482 Chronic atrial fibrillation: Secondary | ICD-10-CM | POA: Diagnosis not present

## 2016-10-02 DIAGNOSIS — I480 Paroxysmal atrial fibrillation: Secondary | ICD-10-CM | POA: Diagnosis not present

## 2016-10-02 DIAGNOSIS — Z7901 Long term (current) use of anticoagulants: Secondary | ICD-10-CM | POA: Diagnosis not present

## 2016-10-02 DIAGNOSIS — I4891 Unspecified atrial fibrillation: Secondary | ICD-10-CM | POA: Diagnosis not present

## 2016-10-02 DIAGNOSIS — F17210 Nicotine dependence, cigarettes, uncomplicated: Secondary | ICD-10-CM | POA: Diagnosis not present

## 2016-10-02 DIAGNOSIS — R072 Precordial pain: Secondary | ICD-10-CM | POA: Diagnosis not present

## 2016-10-02 DIAGNOSIS — J449 Chronic obstructive pulmonary disease, unspecified: Secondary | ICD-10-CM | POA: Diagnosis not present

## 2016-10-02 DIAGNOSIS — R0609 Other forms of dyspnea: Secondary | ICD-10-CM | POA: Diagnosis not present

## 2016-10-02 DIAGNOSIS — I2510 Atherosclerotic heart disease of native coronary artery without angina pectoris: Secondary | ICD-10-CM | POA: Diagnosis not present

## 2016-10-02 DIAGNOSIS — R079 Chest pain, unspecified: Secondary | ICD-10-CM | POA: Diagnosis not present

## 2016-10-02 DIAGNOSIS — R0600 Dyspnea, unspecified: Secondary | ICD-10-CM | POA: Diagnosis not present

## 2016-10-02 DIAGNOSIS — I509 Heart failure, unspecified: Secondary | ICD-10-CM | POA: Diagnosis not present

## 2016-10-02 DIAGNOSIS — Z79899 Other long term (current) drug therapy: Secondary | ICD-10-CM | POA: Diagnosis not present

## 2016-10-02 DIAGNOSIS — R51 Headache: Secondary | ICD-10-CM | POA: Diagnosis not present

## 2016-10-02 DIAGNOSIS — Z7902 Long term (current) use of antithrombotics/antiplatelets: Secondary | ICD-10-CM | POA: Diagnosis not present

## 2016-10-02 DIAGNOSIS — K219 Gastro-esophageal reflux disease without esophagitis: Secondary | ICD-10-CM | POA: Diagnosis not present

## 2016-10-02 DIAGNOSIS — Z955 Presence of coronary angioplasty implant and graft: Secondary | ICD-10-CM | POA: Diagnosis not present

## 2016-10-02 DIAGNOSIS — I130 Hypertensive heart and chronic kidney disease with heart failure and stage 1 through stage 4 chronic kidney disease, or unspecified chronic kidney disease: Secondary | ICD-10-CM | POA: Diagnosis not present

## 2016-10-02 DIAGNOSIS — I5032 Chronic diastolic (congestive) heart failure: Secondary | ICD-10-CM | POA: Diagnosis not present

## 2016-10-02 DIAGNOSIS — I110 Hypertensive heart disease with heart failure: Secondary | ICD-10-CM | POA: Diagnosis not present

## 2016-10-03 DIAGNOSIS — I509 Heart failure, unspecified: Secondary | ICD-10-CM | POA: Diagnosis not present

## 2016-10-03 DIAGNOSIS — M109 Gout, unspecified: Secondary | ICD-10-CM | POA: Diagnosis not present

## 2016-10-03 DIAGNOSIS — D649 Anemia, unspecified: Secondary | ICD-10-CM | POA: Diagnosis not present

## 2016-10-03 DIAGNOSIS — Z8249 Family history of ischemic heart disease and other diseases of the circulatory system: Secondary | ICD-10-CM | POA: Diagnosis not present

## 2016-10-03 DIAGNOSIS — I480 Paroxysmal atrial fibrillation: Secondary | ICD-10-CM | POA: Diagnosis not present

## 2016-10-03 DIAGNOSIS — I252 Old myocardial infarction: Secondary | ICD-10-CM | POA: Diagnosis not present

## 2016-10-03 DIAGNOSIS — I25118 Atherosclerotic heart disease of native coronary artery with other forms of angina pectoris: Secondary | ICD-10-CM | POA: Diagnosis not present

## 2016-10-03 DIAGNOSIS — N184 Chronic kidney disease, stage 4 (severe): Secondary | ICD-10-CM | POA: Diagnosis not present

## 2016-10-03 DIAGNOSIS — R079 Chest pain, unspecified: Secondary | ICD-10-CM | POA: Diagnosis not present

## 2016-10-03 DIAGNOSIS — I2510 Atherosclerotic heart disease of native coronary artery without angina pectoris: Secondary | ICD-10-CM | POA: Diagnosis not present

## 2016-10-03 DIAGNOSIS — I4891 Unspecified atrial fibrillation: Secondary | ICD-10-CM | POA: Diagnosis not present

## 2016-10-03 DIAGNOSIS — F17210 Nicotine dependence, cigarettes, uncomplicated: Secondary | ICD-10-CM | POA: Diagnosis not present

## 2016-10-03 DIAGNOSIS — K219 Gastro-esophageal reflux disease without esophagitis: Secondary | ICD-10-CM | POA: Diagnosis not present

## 2016-10-03 DIAGNOSIS — Z955 Presence of coronary angioplasty implant and graft: Secondary | ICD-10-CM | POA: Diagnosis not present

## 2016-10-03 DIAGNOSIS — I130 Hypertensive heart and chronic kidney disease with heart failure and stage 1 through stage 4 chronic kidney disease, or unspecified chronic kidney disease: Secondary | ICD-10-CM | POA: Diagnosis not present

## 2016-10-03 DIAGNOSIS — I5032 Chronic diastolic (congestive) heart failure: Secondary | ICD-10-CM | POA: Diagnosis not present

## 2016-10-03 DIAGNOSIS — R0609 Other forms of dyspnea: Secondary | ICD-10-CM | POA: Diagnosis not present

## 2016-10-03 DIAGNOSIS — J449 Chronic obstructive pulmonary disease, unspecified: Secondary | ICD-10-CM | POA: Diagnosis not present

## 2016-10-03 DIAGNOSIS — G4733 Obstructive sleep apnea (adult) (pediatric): Secondary | ICD-10-CM | POA: Diagnosis not present

## 2016-10-08 DIAGNOSIS — K635 Polyp of colon: Secondary | ICD-10-CM | POA: Diagnosis present

## 2016-10-08 DIAGNOSIS — Z79899 Other long term (current) drug therapy: Secondary | ICD-10-CM | POA: Diagnosis not present

## 2016-10-08 DIAGNOSIS — Z955 Presence of coronary angioplasty implant and graft: Secondary | ICD-10-CM | POA: Diagnosis not present

## 2016-10-08 DIAGNOSIS — R0602 Shortness of breath: Secondary | ICD-10-CM | POA: Diagnosis not present

## 2016-10-08 DIAGNOSIS — N184 Chronic kidney disease, stage 4 (severe): Secondary | ICD-10-CM | POA: Diagnosis not present

## 2016-10-08 DIAGNOSIS — K219 Gastro-esophageal reflux disease without esophagitis: Secondary | ICD-10-CM | POA: Diagnosis not present

## 2016-10-08 DIAGNOSIS — Z888 Allergy status to other drugs, medicaments and biological substances status: Secondary | ICD-10-CM | POA: Diagnosis not present

## 2016-10-08 DIAGNOSIS — M109 Gout, unspecified: Secondary | ICD-10-CM | POA: Diagnosis present

## 2016-10-08 DIAGNOSIS — D638 Anemia in other chronic diseases classified elsewhere: Secondary | ICD-10-CM | POA: Diagnosis present

## 2016-10-08 DIAGNOSIS — I480 Paroxysmal atrial fibrillation: Secondary | ICD-10-CM | POA: Diagnosis not present

## 2016-10-08 DIAGNOSIS — D509 Iron deficiency anemia, unspecified: Secondary | ICD-10-CM | POA: Diagnosis present

## 2016-10-08 DIAGNOSIS — I2510 Atherosclerotic heart disease of native coronary artery without angina pectoris: Secondary | ICD-10-CM | POA: Diagnosis not present

## 2016-10-08 DIAGNOSIS — I130 Hypertensive heart and chronic kidney disease with heart failure and stage 1 through stage 4 chronic kidney disease, or unspecified chronic kidney disease: Secondary | ICD-10-CM | POA: Diagnosis present

## 2016-10-08 DIAGNOSIS — D131 Benign neoplasm of stomach: Secondary | ICD-10-CM | POA: Diagnosis not present

## 2016-10-08 DIAGNOSIS — D62 Acute posthemorrhagic anemia: Secondary | ICD-10-CM | POA: Diagnosis not present

## 2016-10-08 DIAGNOSIS — D649 Anemia, unspecified: Secondary | ICD-10-CM | POA: Diagnosis not present

## 2016-10-08 DIAGNOSIS — J449 Chronic obstructive pulmonary disease, unspecified: Secondary | ICD-10-CM | POA: Diagnosis not present

## 2016-10-08 DIAGNOSIS — E1122 Type 2 diabetes mellitus with diabetic chronic kidney disease: Secondary | ICD-10-CM | POA: Diagnosis not present

## 2016-10-08 DIAGNOSIS — E7800 Pure hypercholesterolemia, unspecified: Secondary | ICD-10-CM | POA: Diagnosis present

## 2016-10-08 DIAGNOSIS — Z7902 Long term (current) use of antithrombotics/antiplatelets: Secondary | ICD-10-CM | POA: Diagnosis not present

## 2016-10-08 DIAGNOSIS — K208 Other esophagitis: Secondary | ICD-10-CM | POA: Diagnosis not present

## 2016-10-08 DIAGNOSIS — D120 Benign neoplasm of cecum: Secondary | ICD-10-CM | POA: Diagnosis not present

## 2016-10-08 DIAGNOSIS — K449 Diaphragmatic hernia without obstruction or gangrene: Secondary | ICD-10-CM | POA: Diagnosis present

## 2016-10-08 DIAGNOSIS — G4733 Obstructive sleep apnea (adult) (pediatric): Secondary | ICD-10-CM | POA: Diagnosis not present

## 2016-10-08 DIAGNOSIS — I252 Old myocardial infarction: Secondary | ICD-10-CM | POA: Diagnosis not present

## 2016-10-08 DIAGNOSIS — K922 Gastrointestinal hemorrhage, unspecified: Secondary | ICD-10-CM | POA: Diagnosis not present

## 2016-10-08 DIAGNOSIS — F419 Anxiety disorder, unspecified: Secondary | ICD-10-CM | POA: Diagnosis present

## 2016-10-08 DIAGNOSIS — I5032 Chronic diastolic (congestive) heart failure: Secondary | ICD-10-CM | POA: Diagnosis not present

## 2016-10-08 DIAGNOSIS — K921 Melena: Secondary | ICD-10-CM | POA: Diagnosis present

## 2016-10-08 DIAGNOSIS — I509 Heart failure, unspecified: Secondary | ICD-10-CM | POA: Diagnosis not present

## 2016-10-08 DIAGNOSIS — Z885 Allergy status to narcotic agent status: Secondary | ICD-10-CM | POA: Diagnosis not present

## 2016-10-10 DIAGNOSIS — K219 Gastro-esophageal reflux disease without esophagitis: Secondary | ICD-10-CM

## 2016-10-10 DIAGNOSIS — I2510 Atherosclerotic heart disease of native coronary artery without angina pectoris: Secondary | ICD-10-CM

## 2016-10-11 DIAGNOSIS — G4733 Obstructive sleep apnea (adult) (pediatric): Secondary | ICD-10-CM

## 2016-10-14 DIAGNOSIS — D649 Anemia, unspecified: Secondary | ICD-10-CM | POA: Diagnosis not present

## 2016-10-14 DIAGNOSIS — N184 Chronic kidney disease, stage 4 (severe): Secondary | ICD-10-CM | POA: Diagnosis not present

## 2016-10-14 DIAGNOSIS — Z6834 Body mass index (BMI) 34.0-34.9, adult: Secondary | ICD-10-CM | POA: Diagnosis not present

## 2016-10-14 DIAGNOSIS — I4891 Unspecified atrial fibrillation: Secondary | ICD-10-CM | POA: Diagnosis not present

## 2016-10-15 DIAGNOSIS — R195 Other fecal abnormalities: Secondary | ICD-10-CM | POA: Diagnosis not present

## 2016-10-15 DIAGNOSIS — K7460 Unspecified cirrhosis of liver: Secondary | ICD-10-CM | POA: Diagnosis not present

## 2016-10-15 DIAGNOSIS — D689 Coagulation defect, unspecified: Secondary | ICD-10-CM | POA: Diagnosis not present

## 2016-10-15 DIAGNOSIS — R1013 Epigastric pain: Secondary | ICD-10-CM | POA: Diagnosis not present

## 2016-10-15 DIAGNOSIS — D500 Iron deficiency anemia secondary to blood loss (chronic): Secondary | ICD-10-CM | POA: Diagnosis not present

## 2016-10-16 DIAGNOSIS — I130 Hypertensive heart and chronic kidney disease with heart failure and stage 1 through stage 4 chronic kidney disease, or unspecified chronic kidney disease: Secondary | ICD-10-CM | POA: Diagnosis not present

## 2016-10-16 DIAGNOSIS — I25110 Atherosclerotic heart disease of native coronary artery with unstable angina pectoris: Secondary | ICD-10-CM | POA: Diagnosis not present

## 2016-10-16 DIAGNOSIS — N184 Chronic kidney disease, stage 4 (severe): Secondary | ICD-10-CM

## 2016-10-16 DIAGNOSIS — I5042 Chronic combined systolic (congestive) and diastolic (congestive) heart failure: Secondary | ICD-10-CM | POA: Diagnosis not present

## 2016-10-16 DIAGNOSIS — D631 Anemia in chronic kidney disease: Secondary | ICD-10-CM

## 2016-10-16 DIAGNOSIS — I480 Paroxysmal atrial fibrillation: Secondary | ICD-10-CM | POA: Diagnosis not present

## 2016-10-16 HISTORY — DX: Chronic kidney disease, stage 4 (severe): D63.1

## 2016-10-16 HISTORY — DX: Chronic kidney disease, stage 4 (severe): N18.4

## 2016-10-22 DIAGNOSIS — I480 Paroxysmal atrial fibrillation: Secondary | ICD-10-CM | POA: Diagnosis not present

## 2016-10-26 DIAGNOSIS — I480 Paroxysmal atrial fibrillation: Secondary | ICD-10-CM | POA: Diagnosis not present

## 2016-11-10 DIAGNOSIS — N2581 Secondary hyperparathyroidism of renal origin: Secondary | ICD-10-CM | POA: Diagnosis not present

## 2016-11-10 DIAGNOSIS — E1129 Type 2 diabetes mellitus with other diabetic kidney complication: Secondary | ICD-10-CM | POA: Diagnosis not present

## 2016-11-10 DIAGNOSIS — Z6832 Body mass index (BMI) 32.0-32.9, adult: Secondary | ICD-10-CM | POA: Diagnosis not present

## 2016-11-10 DIAGNOSIS — N185 Chronic kidney disease, stage 5: Secondary | ICD-10-CM | POA: Diagnosis not present

## 2016-11-12 DIAGNOSIS — R195 Other fecal abnormalities: Secondary | ICD-10-CM | POA: Diagnosis not present

## 2016-11-12 DIAGNOSIS — K7460 Unspecified cirrhosis of liver: Secondary | ICD-10-CM | POA: Diagnosis not present

## 2016-11-12 DIAGNOSIS — D500 Iron deficiency anemia secondary to blood loss (chronic): Secondary | ICD-10-CM | POA: Diagnosis not present

## 2016-11-12 DIAGNOSIS — Z1212 Encounter for screening for malignant neoplasm of rectum: Secondary | ICD-10-CM | POA: Diagnosis not present

## 2016-11-17 DIAGNOSIS — G4733 Obstructive sleep apnea (adult) (pediatric): Secondary | ICD-10-CM | POA: Diagnosis not present

## 2016-11-17 DIAGNOSIS — I700 Atherosclerosis of aorta: Secondary | ICD-10-CM | POA: Diagnosis not present

## 2016-11-17 DIAGNOSIS — R0989 Other specified symptoms and signs involving the circulatory and respiratory systems: Secondary | ICD-10-CM | POA: Diagnosis not present

## 2016-11-17 DIAGNOSIS — K5520 Angiodysplasia of colon without hemorrhage: Secondary | ICD-10-CM | POA: Diagnosis not present

## 2016-11-17 DIAGNOSIS — Z7902 Long term (current) use of antithrombotics/antiplatelets: Secondary | ICD-10-CM | POA: Diagnosis not present

## 2016-11-17 DIAGNOSIS — R918 Other nonspecific abnormal finding of lung field: Secondary | ICD-10-CM | POA: Diagnosis not present

## 2016-11-17 DIAGNOSIS — I480 Paroxysmal atrial fibrillation: Secondary | ICD-10-CM | POA: Diagnosis not present

## 2016-11-17 DIAGNOSIS — N185 Chronic kidney disease, stage 5: Secondary | ICD-10-CM

## 2016-11-17 DIAGNOSIS — Z01818 Encounter for other preprocedural examination: Secondary | ICD-10-CM | POA: Insufficient documentation

## 2016-11-17 DIAGNOSIS — I5040 Unspecified combined systolic (congestive) and diastolic (congestive) heart failure: Secondary | ICD-10-CM | POA: Diagnosis not present

## 2016-11-17 DIAGNOSIS — K7469 Other cirrhosis of liver: Secondary | ICD-10-CM

## 2016-11-17 DIAGNOSIS — I132 Hypertensive heart and chronic kidney disease with heart failure and with stage 5 chronic kidney disease, or end stage renal disease: Secondary | ICD-10-CM | POA: Diagnosis not present

## 2016-11-17 DIAGNOSIS — J8410 Pulmonary fibrosis, unspecified: Secondary | ICD-10-CM | POA: Diagnosis not present

## 2016-11-17 DIAGNOSIS — Z955 Presence of coronary angioplasty implant and graft: Secondary | ICD-10-CM | POA: Diagnosis not present

## 2016-11-17 DIAGNOSIS — D649 Anemia, unspecified: Secondary | ICD-10-CM | POA: Diagnosis not present

## 2016-11-17 HISTORY — DX: Other cirrhosis of liver: K74.69

## 2016-11-17 HISTORY — DX: Chronic kidney disease, stage 5: N18.5

## 2016-11-17 HISTORY — DX: Encounter for other preprocedural examination: Z01.818

## 2016-11-20 DIAGNOSIS — G4733 Obstructive sleep apnea (adult) (pediatric): Secondary | ICD-10-CM | POA: Diagnosis not present

## 2016-11-25 DIAGNOSIS — G4733 Obstructive sleep apnea (adult) (pediatric): Secondary | ICD-10-CM | POA: Diagnosis not present

## 2016-11-30 DIAGNOSIS — K7460 Unspecified cirrhosis of liver: Secondary | ICD-10-CM | POA: Diagnosis not present

## 2016-11-30 DIAGNOSIS — M546 Pain in thoracic spine: Secondary | ICD-10-CM | POA: Diagnosis not present

## 2016-12-14 DIAGNOSIS — Z23 Encounter for immunization: Secondary | ICD-10-CM | POA: Diagnosis not present

## 2016-12-14 DIAGNOSIS — Z111 Encounter for screening for respiratory tuberculosis: Secondary | ICD-10-CM | POA: Diagnosis not present

## 2016-12-14 DIAGNOSIS — K429 Umbilical hernia without obstruction or gangrene: Secondary | ICD-10-CM | POA: Diagnosis not present

## 2016-12-14 DIAGNOSIS — Z6834 Body mass index (BMI) 34.0-34.9, adult: Secondary | ICD-10-CM | POA: Diagnosis not present

## 2016-12-18 DIAGNOSIS — Z6834 Body mass index (BMI) 34.0-34.9, adult: Secondary | ICD-10-CM | POA: Diagnosis not present

## 2016-12-18 DIAGNOSIS — R05 Cough: Secondary | ICD-10-CM | POA: Diagnosis not present

## 2016-12-29 DIAGNOSIS — Z6832 Body mass index (BMI) 32.0-32.9, adult: Secondary | ICD-10-CM | POA: Diagnosis not present

## 2016-12-29 DIAGNOSIS — R05 Cough: Secondary | ICD-10-CM | POA: Diagnosis not present

## 2017-01-18 DIAGNOSIS — I2510 Atherosclerotic heart disease of native coronary artery without angina pectoris: Secondary | ICD-10-CM | POA: Diagnosis not present

## 2017-01-18 DIAGNOSIS — I480 Paroxysmal atrial fibrillation: Secondary | ICD-10-CM | POA: Diagnosis not present

## 2017-01-18 DIAGNOSIS — K7460 Unspecified cirrhosis of liver: Secondary | ICD-10-CM | POA: Diagnosis not present

## 2017-01-18 DIAGNOSIS — N184 Chronic kidney disease, stage 4 (severe): Secondary | ICD-10-CM | POA: Diagnosis not present

## 2017-01-18 DIAGNOSIS — I10 Essential (primary) hypertension: Secondary | ICD-10-CM | POA: Diagnosis not present

## 2017-01-18 DIAGNOSIS — K922 Gastrointestinal hemorrhage, unspecified: Secondary | ICD-10-CM | POA: Diagnosis not present

## 2017-01-18 DIAGNOSIS — D509 Iron deficiency anemia, unspecified: Secondary | ICD-10-CM | POA: Diagnosis not present

## 2017-01-18 DIAGNOSIS — E785 Hyperlipidemia, unspecified: Secondary | ICD-10-CM | POA: Diagnosis not present

## 2017-02-17 DIAGNOSIS — Z23 Encounter for immunization: Secondary | ICD-10-CM | POA: Diagnosis not present

## 2017-03-06 DIAGNOSIS — S81812A Laceration without foreign body, left lower leg, initial encounter: Secondary | ICD-10-CM | POA: Diagnosis not present

## 2017-03-10 DIAGNOSIS — N184 Chronic kidney disease, stage 4 (severe): Secondary | ICD-10-CM | POA: Diagnosis not present

## 2017-03-10 DIAGNOSIS — E1169 Type 2 diabetes mellitus with other specified complication: Secondary | ICD-10-CM | POA: Diagnosis not present

## 2017-03-12 DIAGNOSIS — I509 Heart failure, unspecified: Secondary | ICD-10-CM | POA: Diagnosis not present

## 2017-03-12 DIAGNOSIS — Z79899 Other long term (current) drug therapy: Secondary | ICD-10-CM | POA: Diagnosis not present

## 2017-03-12 DIAGNOSIS — I480 Paroxysmal atrial fibrillation: Secondary | ICD-10-CM | POA: Diagnosis not present

## 2017-03-12 DIAGNOSIS — G4733 Obstructive sleep apnea (adult) (pediatric): Secondary | ICD-10-CM | POA: Diagnosis not present

## 2017-03-12 DIAGNOSIS — I2510 Atherosclerotic heart disease of native coronary artery without angina pectoris: Secondary | ICD-10-CM | POA: Diagnosis not present

## 2017-03-12 DIAGNOSIS — R05 Cough: Secondary | ICD-10-CM | POA: Diagnosis not present

## 2017-03-12 DIAGNOSIS — E875 Hyperkalemia: Secondary | ICD-10-CM | POA: Diagnosis not present

## 2017-03-12 DIAGNOSIS — R509 Fever, unspecified: Secondary | ICD-10-CM | POA: Diagnosis not present

## 2017-03-12 DIAGNOSIS — I130 Hypertensive heart and chronic kidney disease with heart failure and stage 1 through stage 4 chronic kidney disease, or unspecified chronic kidney disease: Secondary | ICD-10-CM | POA: Diagnosis not present

## 2017-03-12 DIAGNOSIS — N184 Chronic kidney disease, stage 4 (severe): Secondary | ICD-10-CM | POA: Diagnosis not present

## 2017-03-12 DIAGNOSIS — R69 Illness, unspecified: Secondary | ICD-10-CM | POA: Diagnosis not present

## 2017-03-12 DIAGNOSIS — E876 Hypokalemia: Secondary | ICD-10-CM | POA: Diagnosis not present

## 2017-03-12 DIAGNOSIS — K219 Gastro-esophageal reflux disease without esophagitis: Secondary | ICD-10-CM | POA: Diagnosis not present

## 2017-03-12 DIAGNOSIS — R079 Chest pain, unspecified: Secondary | ICD-10-CM | POA: Diagnosis not present

## 2017-03-12 DIAGNOSIS — F17210 Nicotine dependence, cigarettes, uncomplicated: Secondary | ICD-10-CM | POA: Diagnosis not present

## 2017-03-12 DIAGNOSIS — G4723 Circadian rhythm sleep disorder, irregular sleep wake type: Secondary | ICD-10-CM | POA: Diagnosis not present

## 2017-03-12 DIAGNOSIS — M109 Gout, unspecified: Secondary | ICD-10-CM | POA: Diagnosis not present

## 2017-03-12 DIAGNOSIS — R0602 Shortness of breath: Secondary | ICD-10-CM | POA: Diagnosis not present

## 2017-03-12 DIAGNOSIS — R0789 Other chest pain: Secondary | ICD-10-CM | POA: Diagnosis not present

## 2017-03-13 DIAGNOSIS — I2510 Atherosclerotic heart disease of native coronary artery without angina pectoris: Secondary | ICD-10-CM | POA: Diagnosis not present

## 2017-03-13 DIAGNOSIS — E876 Hypokalemia: Secondary | ICD-10-CM | POA: Diagnosis not present

## 2017-03-13 DIAGNOSIS — E785 Hyperlipidemia, unspecified: Secondary | ICD-10-CM

## 2017-03-13 DIAGNOSIS — I480 Paroxysmal atrial fibrillation: Secondary | ICD-10-CM | POA: Diagnosis not present

## 2017-03-13 DIAGNOSIS — N184 Chronic kidney disease, stage 4 (severe): Secondary | ICD-10-CM | POA: Diagnosis not present

## 2017-03-13 DIAGNOSIS — I509 Heart failure, unspecified: Secondary | ICD-10-CM | POA: Diagnosis not present

## 2017-03-13 DIAGNOSIS — K219 Gastro-esophageal reflux disease without esophagitis: Secondary | ICD-10-CM | POA: Diagnosis not present

## 2017-03-13 DIAGNOSIS — R079 Chest pain, unspecified: Secondary | ICD-10-CM

## 2017-03-13 DIAGNOSIS — G4733 Obstructive sleep apnea (adult) (pediatric): Secondary | ICD-10-CM | POA: Diagnosis not present

## 2017-03-13 DIAGNOSIS — R0789 Other chest pain: Secondary | ICD-10-CM | POA: Diagnosis not present

## 2017-03-13 DIAGNOSIS — E1169 Type 2 diabetes mellitus with other specified complication: Secondary | ICD-10-CM

## 2017-03-13 DIAGNOSIS — M109 Gout, unspecified: Secondary | ICD-10-CM | POA: Diagnosis not present

## 2017-03-14 DIAGNOSIS — J449 Chronic obstructive pulmonary disease, unspecified: Secondary | ICD-10-CM | POA: Diagnosis not present

## 2017-03-14 DIAGNOSIS — R0602 Shortness of breath: Secondary | ICD-10-CM | POA: Diagnosis not present

## 2017-03-14 DIAGNOSIS — R0600 Dyspnea, unspecified: Secondary | ICD-10-CM | POA: Diagnosis not present

## 2017-03-14 DIAGNOSIS — J42 Unspecified chronic bronchitis: Secondary | ICD-10-CM | POA: Diagnosis not present

## 2017-03-15 DIAGNOSIS — R69 Illness, unspecified: Secondary | ICD-10-CM | POA: Diagnosis not present

## 2017-03-19 DIAGNOSIS — R0602 Shortness of breath: Secondary | ICD-10-CM | POA: Diagnosis not present

## 2017-03-19 DIAGNOSIS — I509 Heart failure, unspecified: Secondary | ICD-10-CM | POA: Diagnosis not present

## 2017-03-19 DIAGNOSIS — Z23 Encounter for immunization: Secondary | ICD-10-CM | POA: Diagnosis not present

## 2017-03-19 DIAGNOSIS — E1169 Type 2 diabetes mellitus with other specified complication: Secondary | ICD-10-CM | POA: Diagnosis not present

## 2017-03-19 DIAGNOSIS — Z139 Encounter for screening, unspecified: Secondary | ICD-10-CM | POA: Diagnosis not present

## 2017-03-19 DIAGNOSIS — J449 Chronic obstructive pulmonary disease, unspecified: Secondary | ICD-10-CM | POA: Diagnosis not present

## 2017-03-31 DIAGNOSIS — R69 Illness, unspecified: Secondary | ICD-10-CM | POA: Diagnosis not present

## 2017-03-31 DIAGNOSIS — N186 End stage renal disease: Secondary | ICD-10-CM | POA: Diagnosis not present

## 2017-03-31 DIAGNOSIS — D3501 Benign neoplasm of right adrenal gland: Secondary | ICD-10-CM | POA: Diagnosis not present

## 2017-03-31 DIAGNOSIS — Z7289 Other problems related to lifestyle: Secondary | ICD-10-CM | POA: Diagnosis not present

## 2017-03-31 DIAGNOSIS — Z125 Encounter for screening for malignant neoplasm of prostate: Secondary | ICD-10-CM | POA: Diagnosis not present

## 2017-03-31 DIAGNOSIS — N184 Chronic kidney disease, stage 4 (severe): Secondary | ICD-10-CM | POA: Diagnosis not present

## 2017-03-31 DIAGNOSIS — K7460 Unspecified cirrhosis of liver: Secondary | ICD-10-CM | POA: Diagnosis not present

## 2017-03-31 DIAGNOSIS — E1122 Type 2 diabetes mellitus with diabetic chronic kidney disease: Secondary | ICD-10-CM | POA: Diagnosis not present

## 2017-03-31 DIAGNOSIS — I70209 Unspecified atherosclerosis of native arteries of extremities, unspecified extremity: Secondary | ICD-10-CM | POA: Diagnosis not present

## 2017-03-31 DIAGNOSIS — Z114 Encounter for screening for human immunodeficiency virus [HIV]: Secondary | ICD-10-CM | POA: Diagnosis not present

## 2017-03-31 DIAGNOSIS — Z01818 Encounter for other preprocedural examination: Secondary | ICD-10-CM | POA: Diagnosis not present

## 2017-03-31 DIAGNOSIS — Z01810 Encounter for preprocedural cardiovascular examination: Secondary | ICD-10-CM | POA: Diagnosis not present

## 2017-03-31 DIAGNOSIS — I6523 Occlusion and stenosis of bilateral carotid arteries: Secondary | ICD-10-CM | POA: Diagnosis not present

## 2017-04-13 DIAGNOSIS — J42 Unspecified chronic bronchitis: Secondary | ICD-10-CM | POA: Diagnosis not present

## 2017-04-13 DIAGNOSIS — J449 Chronic obstructive pulmonary disease, unspecified: Secondary | ICD-10-CM | POA: Diagnosis not present

## 2017-04-16 DIAGNOSIS — K7460 Unspecified cirrhosis of liver: Secondary | ICD-10-CM | POA: Diagnosis not present

## 2017-04-22 DIAGNOSIS — R195 Other fecal abnormalities: Secondary | ICD-10-CM | POA: Diagnosis not present

## 2017-04-22 DIAGNOSIS — K219 Gastro-esophageal reflux disease without esophagitis: Secondary | ICD-10-CM | POA: Diagnosis not present

## 2017-04-22 DIAGNOSIS — D500 Iron deficiency anemia secondary to blood loss (chronic): Secondary | ICD-10-CM | POA: Diagnosis not present

## 2017-04-22 DIAGNOSIS — K7460 Unspecified cirrhosis of liver: Secondary | ICD-10-CM | POA: Diagnosis not present

## 2017-04-24 DIAGNOSIS — R0602 Shortness of breath: Secondary | ICD-10-CM | POA: Diagnosis not present

## 2017-04-24 DIAGNOSIS — I2510 Atherosclerotic heart disease of native coronary artery without angina pectoris: Secondary | ICD-10-CM | POA: Diagnosis not present

## 2017-04-24 DIAGNOSIS — I249 Acute ischemic heart disease, unspecified: Secondary | ICD-10-CM | POA: Diagnosis not present

## 2017-04-24 DIAGNOSIS — N184 Chronic kidney disease, stage 4 (severe): Secondary | ICD-10-CM | POA: Diagnosis not present

## 2017-04-24 DIAGNOSIS — I10 Essential (primary) hypertension: Secondary | ICD-10-CM | POA: Diagnosis not present

## 2017-04-24 DIAGNOSIS — R944 Abnormal results of kidney function studies: Secondary | ICD-10-CM | POA: Diagnosis not present

## 2017-04-24 DIAGNOSIS — R079 Chest pain, unspecified: Secondary | ICD-10-CM | POA: Diagnosis not present

## 2017-04-25 DIAGNOSIS — N185 Chronic kidney disease, stage 5: Secondary | ICD-10-CM | POA: Diagnosis not present

## 2017-04-25 DIAGNOSIS — Z79899 Other long term (current) drug therapy: Secondary | ICD-10-CM | POA: Diagnosis not present

## 2017-04-25 DIAGNOSIS — Z955 Presence of coronary angioplasty implant and graft: Secondary | ICD-10-CM | POA: Diagnosis not present

## 2017-04-25 DIAGNOSIS — I120 Hypertensive chronic kidney disease with stage 5 chronic kidney disease or end stage renal disease: Secondary | ICD-10-CM | POA: Diagnosis not present

## 2017-04-25 DIAGNOSIS — Z7982 Long term (current) use of aspirin: Secondary | ICD-10-CM | POA: Diagnosis not present

## 2017-04-25 DIAGNOSIS — I2510 Atherosclerotic heart disease of native coronary artery without angina pectoris: Secondary | ICD-10-CM | POA: Diagnosis not present

## 2017-04-25 DIAGNOSIS — R69 Illness, unspecified: Secondary | ICD-10-CM | POA: Diagnosis not present

## 2017-04-25 DIAGNOSIS — R7989 Other specified abnormal findings of blood chemistry: Secondary | ICD-10-CM | POA: Diagnosis not present

## 2017-04-25 DIAGNOSIS — I447 Left bundle-branch block, unspecified: Secondary | ICD-10-CM | POA: Diagnosis not present

## 2017-04-25 DIAGNOSIS — I491 Atrial premature depolarization: Secondary | ICD-10-CM | POA: Diagnosis not present

## 2017-04-25 DIAGNOSIS — R079 Chest pain, unspecified: Secondary | ICD-10-CM | POA: Diagnosis not present

## 2017-04-26 DIAGNOSIS — I4891 Unspecified atrial fibrillation: Secondary | ICD-10-CM | POA: Diagnosis not present

## 2017-04-26 DIAGNOSIS — I447 Left bundle-branch block, unspecified: Secondary | ICD-10-CM | POA: Diagnosis not present

## 2017-04-26 DIAGNOSIS — I480 Paroxysmal atrial fibrillation: Secondary | ICD-10-CM | POA: Diagnosis not present

## 2017-04-26 DIAGNOSIS — I129 Hypertensive chronic kidney disease with stage 1 through stage 4 chronic kidney disease, or unspecified chronic kidney disease: Secondary | ICD-10-CM | POA: Diagnosis not present

## 2017-04-26 DIAGNOSIS — R079 Chest pain, unspecified: Secondary | ICD-10-CM | POA: Diagnosis not present

## 2017-04-26 DIAGNOSIS — N184 Chronic kidney disease, stage 4 (severe): Secondary | ICD-10-CM | POA: Diagnosis not present

## 2017-04-26 DIAGNOSIS — Z955 Presence of coronary angioplasty implant and graft: Secondary | ICD-10-CM | POA: Diagnosis not present

## 2017-04-26 DIAGNOSIS — I2510 Atherosclerotic heart disease of native coronary artery without angina pectoris: Secondary | ICD-10-CM | POA: Diagnosis not present

## 2017-04-29 DIAGNOSIS — D500 Iron deficiency anemia secondary to blood loss (chronic): Secondary | ICD-10-CM | POA: Diagnosis not present

## 2017-05-10 DIAGNOSIS — N184 Chronic kidney disease, stage 4 (severe): Secondary | ICD-10-CM | POA: Diagnosis not present

## 2017-05-10 DIAGNOSIS — E1169 Type 2 diabetes mellitus with other specified complication: Secondary | ICD-10-CM | POA: Diagnosis not present

## 2017-05-11 DIAGNOSIS — F419 Anxiety disorder, unspecified: Secondary | ICD-10-CM | POA: Insufficient documentation

## 2017-05-11 DIAGNOSIS — F32A Depression, unspecified: Secondary | ICD-10-CM | POA: Insufficient documentation

## 2017-05-11 DIAGNOSIS — R011 Cardiac murmur, unspecified: Secondary | ICD-10-CM | POA: Insufficient documentation

## 2017-05-11 DIAGNOSIS — G5603 Carpal tunnel syndrome, bilateral upper limbs: Secondary | ICD-10-CM | POA: Insufficient documentation

## 2017-05-11 DIAGNOSIS — Z9289 Personal history of other medical treatment: Secondary | ICD-10-CM | POA: Insufficient documentation

## 2017-05-11 DIAGNOSIS — N186 End stage renal disease: Secondary | ICD-10-CM | POA: Insufficient documentation

## 2017-05-11 DIAGNOSIS — M109 Gout, unspecified: Secondary | ICD-10-CM | POA: Insufficient documentation

## 2017-05-11 DIAGNOSIS — F329 Major depressive disorder, single episode, unspecified: Secondary | ICD-10-CM | POA: Insufficient documentation

## 2017-05-11 DIAGNOSIS — Z9989 Dependence on other enabling machines and devices: Secondary | ICD-10-CM

## 2017-05-11 DIAGNOSIS — E785 Hyperlipidemia, unspecified: Secondary | ICD-10-CM | POA: Insufficient documentation

## 2017-05-11 DIAGNOSIS — T4145XA Adverse effect of unspecified anesthetic, initial encounter: Secondary | ICD-10-CM | POA: Insufficient documentation

## 2017-05-11 DIAGNOSIS — I6529 Occlusion and stenosis of unspecified carotid artery: Secondary | ICD-10-CM | POA: Insufficient documentation

## 2017-05-11 DIAGNOSIS — R0609 Other forms of dyspnea: Secondary | ICD-10-CM | POA: Insufficient documentation

## 2017-05-11 DIAGNOSIS — I219 Acute myocardial infarction, unspecified: Secondary | ICD-10-CM | POA: Insufficient documentation

## 2017-05-11 DIAGNOSIS — T8859XA Other complications of anesthesia, initial encounter: Secondary | ICD-10-CM | POA: Insufficient documentation

## 2017-05-11 DIAGNOSIS — D519 Vitamin B12 deficiency anemia, unspecified: Secondary | ICD-10-CM | POA: Insufficient documentation

## 2017-05-11 DIAGNOSIS — G4733 Obstructive sleep apnea (adult) (pediatric): Secondary | ICD-10-CM | POA: Insufficient documentation

## 2017-05-11 DIAGNOSIS — K922 Gastrointestinal hemorrhage, unspecified: Secondary | ICD-10-CM

## 2017-05-11 DIAGNOSIS — G43909 Migraine, unspecified, not intractable, without status migrainosus: Secondary | ICD-10-CM | POA: Insufficient documentation

## 2017-05-11 DIAGNOSIS — K219 Gastro-esophageal reflux disease without esophagitis: Secondary | ICD-10-CM | POA: Insufficient documentation

## 2017-05-11 HISTORY — DX: Gastrointestinal hemorrhage, unspecified: K92.2

## 2017-05-14 ENCOUNTER — Ambulatory Visit (INDEPENDENT_AMBULATORY_CARE_PROVIDER_SITE_OTHER): Payer: Medicare HMO | Admitting: Cardiology

## 2017-05-14 ENCOUNTER — Encounter: Payer: Self-pay | Admitting: Cardiology

## 2017-05-14 VITALS — BP 140/62 | HR 56 | Resp 10 | Ht 73.0 in | Wt 224.4 lb

## 2017-05-14 DIAGNOSIS — I10 Essential (primary) hypertension: Secondary | ICD-10-CM

## 2017-05-14 DIAGNOSIS — Z9989 Dependence on other enabling machines and devices: Secondary | ICD-10-CM | POA: Diagnosis not present

## 2017-05-14 DIAGNOSIS — I4892 Unspecified atrial flutter: Secondary | ICD-10-CM | POA: Diagnosis not present

## 2017-05-14 DIAGNOSIS — G4733 Obstructive sleep apnea (adult) (pediatric): Secondary | ICD-10-CM

## 2017-05-14 DIAGNOSIS — I480 Paroxysmal atrial fibrillation: Secondary | ICD-10-CM

## 2017-05-14 DIAGNOSIS — E785 Hyperlipidemia, unspecified: Secondary | ICD-10-CM

## 2017-05-14 DIAGNOSIS — I2510 Atherosclerotic heart disease of native coronary artery without angina pectoris: Secondary | ICD-10-CM

## 2017-05-14 DIAGNOSIS — J449 Chronic obstructive pulmonary disease, unspecified: Secondary | ICD-10-CM | POA: Diagnosis not present

## 2017-05-14 DIAGNOSIS — J42 Unspecified chronic bronchitis: Secondary | ICD-10-CM | POA: Diagnosis not present

## 2017-05-14 NOTE — Progress Notes (Signed)
Cardiology Office Note:    Date:  05/14/2017   ID:  Carlos Heath, DOB 06/10/1966, MRN [id number]  PCP:  Maryella Shivers, MD  Cardiologist:  Jenne Campus, MD    Referring MD: Maryella Shivers, MD   Chief Complaint  Patient presents with  . Follow-up  . Hospitalization Follow-up  I am doing better  History of Present Illness:    Carlos Heath is a 51 y.o. male with multiple medical problems he does have advanced kidney disease, coronary artery disease, hypertension.  He was recently in the round of hospital because of atypical chest pain.  He eventually ended up being transferred to [facility], but because of his advanced kidney disease and unclear symptoms not much was done except for adjustment of his medications.  He said he is doing fine but at the same time he tells me he does not do anything all day he just sits and watches TV.  He is planning to go to [place] like he does every year to visit his daughter.  Last Saturday he described symptoms of nausea that lasted for a few hours and then passed.  No chest pain tightness squeezing pressure burning chest.  He described palpitations every other night I offered him monitor but he declined.  Past Medical History:  Diagnosis Date  . Acute diastolic CHF (congestive heart failure) ([place]) 07/01/2012  . Anemia in stage 4 chronic kidney disease ([place]) 10/16/2016  . Anginal pain ([place])   . Anxiety   . Atrial flutter ([place]) 04/16/2016  . B12 deficiency anemia    "get shot q month" (07/01/2012)  . CAD (coronary artery disease)   . Carotid artery occlusion   . Carpal tunnel syndrome on both sides    "wore braces for awhile" (07/01/2012)  . Chest pain 08/05/2015  . CHF (congestive heart failure) ([place])   . Chronic combined systolic and diastolic congestive heart failure ([place]) 01/08/2016  . Chronic kidney disease (CKD), stage IV (severe) ([place]) 09/11/2011  . Chronic renal failure, stage 4 (severe) ([place]) 01/31/2015  . Complication of anesthesia    difficulty urinating  . Coronary artery disease involving native coronary artery of native heart without angina pectoris 01/31/2015  . Depression   . Diabetes mellitus ([place]) 07/01/2012  . DVT (deep venous thrombosis) ([place]) 2011   "RLE; after I went home from a cath; loaded me up w/heparin, lovenox" (07/01/2012)  . Dyslipidemia 01/31/2015  . Edema extremities 07/01/2012  . End stage renal disease ([place]) 09/11/2011  . ESRD (end stage renal disease) ([place])    "have 12% function" (07/01/2012)  . Essential hypertension 07/01/2012  . Exertional dyspnea   . GERD (gastroesophageal reflux disease)   . GI bleed 05/11/2017  . Gouty arthritis   . Heart murmur   . History of blood transfusion ?2012   "laceration right wrist; lost lots of blood" (07/01/2012)  . Hyperlipidemia   . Hypertension   . Iron deficiency anemia 2012   "had to get shots q week for awhile" (07/01/2012)  . Migraines    "monthly" (07/01/2012)  . Myocardial infarction [facility]) ~ 2005   "couple they suspect" (07/01/2012)  . Obesity 07/01/2012  . OSA on CPAP 2011  . Other cirrhosis of liver ([place]) 11/17/2016  . Other complications due to renal dialysis device, implant, and graft 11/13/2013  . Paroxysmal atrial fibrillation ([place]) 10/17/2015  . Pre-transplant evaluation for kidney transplant 11/17/2016  . SOB (shortness of breath) 07/01/2012  . Stage 5 chronic kidney disease not on chronic  dialysis ([place]) 11/17/2016  . Supraventricular tachycardia ([place]) 11/26/2015  . Type II diabetes mellitus ([place]) 05/2012    Past Surgical History:  Procedure Laterality Date  . ANTERIOR CERVICAL DECOMP/DISCECTOMY FUSION  ~ 2000   "w/plate" (07/01/2012)  . AV FISTULA PLACEMENT  ?2007   right antecub (07/01/2012)  . CERVICAL SPINE SURGERY    . CORONARY ANGIOPLASTY WITH STENT PLACEMENT  2011 X 3   "1 + 2 + 2; 5 total" (07/01/2012)  . FINGER FRACTURE SURGERY Left   . LACERATION REPAIR  2012   "right wrist; fell thru a glass" (07/01/2012)  . TONSILLECTOMY   1970's  . VEIN SURGERY  03/05/2010   "moved vein up into arm for dialysis access; was too deep"; right upper arm" (07/01/2012)    Current Medications: Current Meds  Medication Sig  . albuterol (ACCUNEB) 0.63 MG/3ML nebulizer solution Take 1 ampule by nebulization every 6 (six) hours as needed.  . cloNIDine (CATAPRES) 0.2 MG tablet Take 0.2 mg by mouth 2 (two) times daily.  . clopidogrel (PLAVIX) 75 MG tablet Take 75 mg by mouth daily.  . cyanocobalamin (,VITAMIN B-12,) 1000 MCG/ML injection Inject 1 mL into the muscle every 30 (thirty) days.  . Cyclobenzaprine HCl (CYCLOBENZAPRINE 20 TD) Take 20 mg by mouth daily.  Marland Kitchen desvenlafaxine (PRISTIQ) 100 MG 24 hr tablet Take 100 mg by mouth daily.  Marland Kitchen dicyclomine (BENTYL) 10 MG capsule Take 1 capsule by mouth 2 (two) times daily as needed.  . febuxostat (ULORIC) 40 MG tablet Take 40 mg by mouth daily.  . Ferrous Sulfate (IRON) 325 (65 Fe) MG TABS Take 1 tablet by mouth daily.   . furosemide (LASIX) 80 MG tablet Take 160 mg by mouth 2 (two) times daily.  . hydrALAZINE (APRESOLINE) 100 MG tablet Take 100 mg by mouth 2 (two) times daily.  Marland Kitchen labetalol (NORMODYNE) 200 MG tablet Take 200 mg by mouth 2 (two) times daily.  . Melatonin 10 MG TABS Take 10 mg by mouth daily.  . metolazone (ZAROXOLYN) 2.5 MG tablet Take 1 tablet by mouth every other day.   . minoxidil (LONITEN) 10 MG tablet Take 10 mg by mouth 2 (two) times daily.  . nitroGLYCERIN (NITROSTAT) 0.4 MG SL tablet Place 0.4 mg under the tongue as needed for chest pain.  Marland Kitchen omeprazole (PRILOSEC) 40 MG capsule Take 40 mg by mouth daily.  Marland Kitchen oxyCODONE (OXY IR/ROXICODONE) 5 MG immediate release tablet Take 1-2 tablets by mouth every 4 (four) hours as needed for pain.  . potassium chloride SA (K-DUR,KLOR-CON) 20 MEQ tablet Take 5 tablets by mouth 2 (two) times daily.  . predniSONE (DELTASONE) 10 MG tablet Take 30 mg by mouth as needed.  . ranolazine (RANEXA) 1000 MG SR tablet Take 1,000 mg by mouth 2 (two)  times daily.  . simvastatin (ZOCOR) 40 MG tablet Take 40 mg by mouth daily.  Marland Kitchen Umeclidinium-Vilanterol (ANORO ELLIPTA IN) Inhale 1 puff into the lungs daily.  Marland Kitchen zolpidem (AMBIEN) 10 MG tablet Take 1 tablet by mouth at bedtime.     Allergies:   Hydrocodone; Norvasc [amlodipine besylate]; Lisinopril; Amlodipine; Morphine; Prednisone; and Morphine and related   Social History   Social History  . Marital status: Married    Spouse name: N/A  . Number of children: N/A  . Years of education: N/A   Social History Main Topics  . Smoking status: Current Every Day Smoker    Packs/day: 1.00    Years: 35.00    Types: Cigarettes  Last attempt to quit: 01/11/2011  . Smokeless tobacco: Never Used  . Alcohol use No     Comment: 07/01/2012 "never drank much; occasionally only; last drink ~ 2 yr ago"  . Drug use: No  . Sexual activity: No   Other Topics Concern  . None   Social History Narrative  . None     Family History: The patient's family history includes Heart attack in his father and mother; Heart disease in his father, mother, and sister; Hyperlipidemia in his father and mother; Hypertension in his brother, father, and mother; Other in his mother; Stroke in his mother. ROS:   Please see the history of present illness.    All 14 point review of systems negative except as described per history of present illness  EKGs/Labs/Other Studies Reviewed:      Recent Labs: No results found for requested labs within last 8760 hours.  Recent Lipid Panel No results found for: CHOL, TRIG, HDL, CHOLHDL, VLDL, LDLCALC, LDLDIRECT  Physical Exam:    VS:  BP 140/62   Pulse (!) 56   Resp 10   Ht 6\' 1"  (1.854 m)   Wt 224 lb 6.4 oz (101.8 kg)   BMI 29.61 kg/m     Wt Readings from Last 3 Encounters:  05/14/17 224 lb 6.4 oz (101.8 kg)  11/13/13 224 lb 14.4 oz (102 kg)  07/04/12 248 lb 10.9 oz (112.8 kg)     GEN:  Well nourished, well developed in no acute distress HEENT: Normal NECK:  No JVD; No carotid bruits LYMPHATICS: No lymphadenopathy CARDIAC: RRR, no murmurs, no rubs, no gallops RESPIRATORY:  Clear to auscultation without rales, wheezing or rhonchi  ABDOMEN: Soft, non-tender, non-distended MUSCULOSKELETAL:  No edema; No deformity  SKIN: Warm and dry LOWER EXTREMITIES: no swelling NEUROLOGIC:  Alert and oriented x 3 PSYCHIATRIC:  Normal affect   ASSESSMENT:    1. Atrial flutter, unspecified type ([place])   2. Coronary artery disease involving native coronary artery of native heart without angina pectoris   3. Essential hypertension   4. Paroxysmal atrial fibrillation (HCC)   5. OSA on CPAP   6. Dyslipidemia    PLAN:    In order of problems listed above:  1. Paroxysmal atrial flutter: Described to have some palpitations we talked about wearing event recorder but he did not want to do that. 2. Coronary artery disease with recent admission to the hospital records reviewed no intervention has been done. 3. Essential hypertension: Blood pressure seems to be well-controlled now. 4. Proximal mitral fibrillation: Not anticoagulated because of history of bleeding. 5. Obstructive sleep apnea on CPAP we will continue 6. Dyslipidemia: On statin which I will continue 7.    Medication Adjustments/Labs and Tests Ordered: Current medicines are reviewed at length with the patient today.  Concerns regarding medicines are outlined above.  Orders Placed This Encounter  Procedures  . EKG 12-Lead   Medication changes: No orders of the defined types were placed in this encounter.   Signed, Park Liter, MD, [facility] 05/14/2017 10:27 AM    [place]

## 2017-05-14 NOTE — Patient Instructions (Addendum)
Medication Instructions:  Your physician recommends that you continue on your current medications as directed. Please refer to the Current Medication list given to you today.  Labwork: None   Testing/Procedures: EKG today in office.   Follow-Up: Your physician recommends that you schedule a follow-up appointment in: 2 weeks   Any Other Special Instructions Will Be Listed Below (If Applicable).  Please note that any paperwork needing to be filled out by the provider will need to be addressed at the front desk prior to seeing the provider. Please note that any paperwork FMLA, Disability or other documents regarding health condition is subject to a $25.00 charge that must be received prior to completion of paperwork in the form of a money order or check.     If you need a refill on your cardiac medications before your next appointment, please call your pharmacy.

## 2017-05-17 DIAGNOSIS — I5040 Unspecified combined systolic (congestive) and diastolic (congestive) heart failure: Secondary | ICD-10-CM | POA: Diagnosis not present

## 2017-05-17 DIAGNOSIS — Z8719 Personal history of other diseases of the digestive system: Secondary | ICD-10-CM | POA: Diagnosis not present

## 2017-05-17 DIAGNOSIS — D631 Anemia in chronic kidney disease: Secondary | ICD-10-CM | POA: Diagnosis not present

## 2017-05-17 DIAGNOSIS — I10 Essential (primary) hypertension: Secondary | ICD-10-CM | POA: Diagnosis not present

## 2017-05-17 DIAGNOSIS — E1129 Type 2 diabetes mellitus with other diabetic kidney complication: Secondary | ICD-10-CM | POA: Diagnosis not present

## 2017-05-17 DIAGNOSIS — I129 Hypertensive chronic kidney disease with stage 1 through stage 4 chronic kidney disease, or unspecified chronic kidney disease: Secondary | ICD-10-CM | POA: Diagnosis not present

## 2017-05-17 DIAGNOSIS — I4892 Unspecified atrial flutter: Secondary | ICD-10-CM | POA: Diagnosis not present

## 2017-05-17 DIAGNOSIS — E785 Hyperlipidemia, unspecified: Secondary | ICD-10-CM | POA: Diagnosis not present

## 2017-05-17 DIAGNOSIS — E1169 Type 2 diabetes mellitus with other specified complication: Secondary | ICD-10-CM | POA: Diagnosis not present

## 2017-05-17 DIAGNOSIS — N184 Chronic kidney disease, stage 4 (severe): Secondary | ICD-10-CM | POA: Diagnosis not present

## 2017-05-17 DIAGNOSIS — M109 Gout, unspecified: Secondary | ICD-10-CM | POA: Diagnosis not present

## 2017-05-17 DIAGNOSIS — K219 Gastro-esophageal reflux disease without esophagitis: Secondary | ICD-10-CM | POA: Diagnosis not present

## 2017-05-17 DIAGNOSIS — N2581 Secondary hyperparathyroidism of renal origin: Secondary | ICD-10-CM | POA: Diagnosis not present

## 2017-05-27 DIAGNOSIS — Z888 Allergy status to other drugs, medicaments and biological substances status: Secondary | ICD-10-CM | POA: Diagnosis not present

## 2017-05-27 DIAGNOSIS — K769 Liver disease, unspecified: Secondary | ICD-10-CM | POA: Diagnosis not present

## 2017-05-27 DIAGNOSIS — K7689 Other specified diseases of liver: Secondary | ICD-10-CM | POA: Diagnosis not present

## 2017-05-27 DIAGNOSIS — Z885 Allergy status to narcotic agent status: Secondary | ICD-10-CM | POA: Diagnosis not present

## 2017-05-27 DIAGNOSIS — N186 End stage renal disease: Secondary | ICD-10-CM | POA: Diagnosis not present

## 2017-05-27 DIAGNOSIS — R935 Abnormal findings on diagnostic imaging of other abdominal regions, including retroperitoneum: Secondary | ICD-10-CM | POA: Diagnosis not present

## 2017-05-27 DIAGNOSIS — Z7902 Long term (current) use of antithrombotics/antiplatelets: Secondary | ICD-10-CM | POA: Diagnosis not present

## 2017-05-28 ENCOUNTER — Ambulatory Visit: Payer: Medicare HMO | Admitting: Cardiology

## 2017-06-02 DIAGNOSIS — R93429 Abnormal radiologic findings on diagnostic imaging of unspecified kidney: Secondary | ICD-10-CM | POA: Diagnosis not present

## 2017-06-02 DIAGNOSIS — K7460 Unspecified cirrhosis of liver: Secondary | ICD-10-CM | POA: Diagnosis not present

## 2017-06-13 DIAGNOSIS — J449 Chronic obstructive pulmonary disease, unspecified: Secondary | ICD-10-CM | POA: Diagnosis not present

## 2017-06-13 DIAGNOSIS — J42 Unspecified chronic bronchitis: Secondary | ICD-10-CM | POA: Diagnosis not present

## 2017-06-16 DIAGNOSIS — Z6832 Body mass index (BMI) 32.0-32.9, adult: Secondary | ICD-10-CM | POA: Diagnosis not present

## 2017-06-16 DIAGNOSIS — E1122 Type 2 diabetes mellitus with diabetic chronic kidney disease: Secondary | ICD-10-CM | POA: Diagnosis not present

## 2017-06-16 DIAGNOSIS — N2581 Secondary hyperparathyroidism of renal origin: Secondary | ICD-10-CM | POA: Diagnosis not present

## 2017-06-16 DIAGNOSIS — I129 Hypertensive chronic kidney disease with stage 1 through stage 4 chronic kidney disease, or unspecified chronic kidney disease: Secondary | ICD-10-CM | POA: Diagnosis not present

## 2017-06-16 DIAGNOSIS — D631 Anemia in chronic kidney disease: Secondary | ICD-10-CM | POA: Diagnosis not present

## 2017-06-16 DIAGNOSIS — E1129 Type 2 diabetes mellitus with other diabetic kidney complication: Secondary | ICD-10-CM | POA: Diagnosis not present

## 2017-06-16 DIAGNOSIS — N184 Chronic kidney disease, stage 4 (severe): Secondary | ICD-10-CM | POA: Diagnosis not present

## 2017-07-14 DIAGNOSIS — J42 Unspecified chronic bronchitis: Secondary | ICD-10-CM | POA: Diagnosis not present

## 2017-07-14 DIAGNOSIS — J449 Chronic obstructive pulmonary disease, unspecified: Secondary | ICD-10-CM | POA: Diagnosis not present

## 2017-08-14 DIAGNOSIS — J449 Chronic obstructive pulmonary disease, unspecified: Secondary | ICD-10-CM | POA: Diagnosis not present

## 2017-08-14 DIAGNOSIS — J42 Unspecified chronic bronchitis: Secondary | ICD-10-CM | POA: Diagnosis not present

## 2017-08-20 DIAGNOSIS — M25561 Pain in right knee: Secondary | ICD-10-CM | POA: Diagnosis not present

## 2017-08-20 DIAGNOSIS — M542 Cervicalgia: Secondary | ICD-10-CM | POA: Diagnosis not present

## 2017-08-23 DIAGNOSIS — M50321 Other cervical disc degeneration at C4-C5 level: Secondary | ICD-10-CM | POA: Diagnosis not present

## 2017-08-23 DIAGNOSIS — M50323 Other cervical disc degeneration at C6-C7 level: Secondary | ICD-10-CM | POA: Diagnosis not present

## 2017-08-23 DIAGNOSIS — M542 Cervicalgia: Secondary | ICD-10-CM | POA: Diagnosis not present

## 2017-08-23 DIAGNOSIS — M50322 Other cervical disc degeneration at C5-C6 level: Secondary | ICD-10-CM | POA: Diagnosis not present

## 2017-08-23 DIAGNOSIS — M25561 Pain in right knee: Secondary | ICD-10-CM | POA: Diagnosis not present

## 2017-08-25 DIAGNOSIS — I4891 Unspecified atrial fibrillation: Secondary | ICD-10-CM | POA: Diagnosis not present

## 2017-08-25 DIAGNOSIS — N186 End stage renal disease: Secondary | ICD-10-CM | POA: Diagnosis not present

## 2017-08-25 DIAGNOSIS — G4733 Obstructive sleep apnea (adult) (pediatric): Secondary | ICD-10-CM | POA: Diagnosis not present

## 2017-08-25 DIAGNOSIS — Z823 Family history of stroke: Secondary | ICD-10-CM | POA: Diagnosis not present

## 2017-08-25 DIAGNOSIS — K7460 Unspecified cirrhosis of liver: Secondary | ICD-10-CM | POA: Diagnosis not present

## 2017-08-25 DIAGNOSIS — I2510 Atherosclerotic heart disease of native coronary artery without angina pectoris: Secondary | ICD-10-CM | POA: Diagnosis not present

## 2017-08-25 DIAGNOSIS — I255 Ischemic cardiomyopathy: Secondary | ICD-10-CM | POA: Diagnosis not present

## 2017-08-25 DIAGNOSIS — I509 Heart failure, unspecified: Secondary | ICD-10-CM | POA: Diagnosis not present

## 2017-08-25 DIAGNOSIS — K219 Gastro-esophageal reflux disease without esophagitis: Secondary | ICD-10-CM | POA: Diagnosis not present

## 2017-08-25 DIAGNOSIS — I132 Hypertensive heart and chronic kidney disease with heart failure and with stage 5 chronic kidney disease, or end stage renal disease: Secondary | ICD-10-CM | POA: Diagnosis not present

## 2017-08-25 DIAGNOSIS — K740 Hepatic fibrosis: Secondary | ICD-10-CM | POA: Diagnosis not present

## 2017-08-27 DIAGNOSIS — J029 Acute pharyngitis, unspecified: Secondary | ICD-10-CM | POA: Diagnosis not present

## 2017-08-27 DIAGNOSIS — Z6836 Body mass index (BMI) 36.0-36.9, adult: Secondary | ICD-10-CM | POA: Diagnosis not present

## 2017-09-03 DIAGNOSIS — R160 Hepatomegaly, not elsewhere classified: Secondary | ICD-10-CM | POA: Diagnosis not present

## 2017-09-03 DIAGNOSIS — R932 Abnormal findings on diagnostic imaging of liver and biliary tract: Secondary | ICD-10-CM | POA: Diagnosis not present

## 2017-09-03 DIAGNOSIS — R93421 Abnormal radiologic findings on diagnostic imaging of right kidney: Secondary | ICD-10-CM | POA: Diagnosis not present

## 2017-09-03 DIAGNOSIS — R93422 Abnormal radiologic findings on diagnostic imaging of left kidney: Secondary | ICD-10-CM | POA: Diagnosis not present

## 2017-09-03 DIAGNOSIS — K740 Hepatic fibrosis: Secondary | ICD-10-CM | POA: Diagnosis not present

## 2017-09-03 DIAGNOSIS — R933 Abnormal findings on diagnostic imaging of other parts of digestive tract: Secondary | ICD-10-CM | POA: Diagnosis not present

## 2017-09-06 DIAGNOSIS — Z6836 Body mass index (BMI) 36.0-36.9, adult: Secondary | ICD-10-CM | POA: Diagnosis not present

## 2017-09-06 DIAGNOSIS — M25569 Pain in unspecified knee: Secondary | ICD-10-CM | POA: Diagnosis not present

## 2017-09-06 DIAGNOSIS — M4722 Other spondylosis with radiculopathy, cervical region: Secondary | ICD-10-CM | POA: Diagnosis not present

## 2017-09-08 DIAGNOSIS — E785 Hyperlipidemia, unspecified: Secondary | ICD-10-CM | POA: Diagnosis not present

## 2017-09-08 DIAGNOSIS — Z139 Encounter for screening, unspecified: Secondary | ICD-10-CM | POA: Diagnosis not present

## 2017-09-08 DIAGNOSIS — E1169 Type 2 diabetes mellitus with other specified complication: Secondary | ICD-10-CM | POA: Diagnosis not present

## 2017-09-08 DIAGNOSIS — Z1331 Encounter for screening for depression: Secondary | ICD-10-CM | POA: Diagnosis not present

## 2017-09-08 DIAGNOSIS — N184 Chronic kidney disease, stage 4 (severe): Secondary | ICD-10-CM | POA: Diagnosis not present

## 2017-09-08 DIAGNOSIS — Z125 Encounter for screening for malignant neoplasm of prostate: Secondary | ICD-10-CM | POA: Diagnosis not present

## 2017-09-08 DIAGNOSIS — Z0000 Encounter for general adult medical examination without abnormal findings: Secondary | ICD-10-CM | POA: Diagnosis not present

## 2017-09-08 DIAGNOSIS — M4722 Other spondylosis with radiculopathy, cervical region: Secondary | ICD-10-CM | POA: Diagnosis not present

## 2017-09-11 DIAGNOSIS — J449 Chronic obstructive pulmonary disease, unspecified: Secondary | ICD-10-CM | POA: Diagnosis not present

## 2017-09-11 DIAGNOSIS — J42 Unspecified chronic bronchitis: Secondary | ICD-10-CM | POA: Diagnosis not present

## 2017-09-17 DIAGNOSIS — N186 End stage renal disease: Secondary | ICD-10-CM | POA: Diagnosis not present

## 2017-09-17 DIAGNOSIS — Z01818 Encounter for other preprocedural examination: Secondary | ICD-10-CM | POA: Diagnosis not present

## 2017-09-17 DIAGNOSIS — Z87891 Personal history of nicotine dependence: Secondary | ICD-10-CM | POA: Diagnosis not present

## 2017-09-17 DIAGNOSIS — I517 Cardiomegaly: Secondary | ICD-10-CM | POA: Diagnosis not present

## 2017-09-17 DIAGNOSIS — R918 Other nonspecific abnormal finding of lung field: Secondary | ICD-10-CM | POA: Diagnosis not present

## 2017-09-21 DIAGNOSIS — G4733 Obstructive sleep apnea (adult) (pediatric): Secondary | ICD-10-CM | POA: Diagnosis not present

## 2017-09-30 DIAGNOSIS — M47892 Other spondylosis, cervical region: Secondary | ICD-10-CM | POA: Diagnosis not present

## 2017-09-30 DIAGNOSIS — Z6835 Body mass index (BMI) 35.0-35.9, adult: Secondary | ICD-10-CM | POA: Diagnosis not present

## 2017-10-02 DIAGNOSIS — M109 Gout, unspecified: Secondary | ICD-10-CM | POA: Diagnosis not present

## 2017-10-02 DIAGNOSIS — M25511 Pain in right shoulder: Secondary | ICD-10-CM | POA: Diagnosis not present

## 2017-10-02 DIAGNOSIS — M25561 Pain in right knee: Secondary | ICD-10-CM | POA: Diagnosis not present

## 2017-10-05 DIAGNOSIS — K7460 Unspecified cirrhosis of liver: Secondary | ICD-10-CM | POA: Diagnosis not present

## 2017-10-05 DIAGNOSIS — K7689 Other specified diseases of liver: Secondary | ICD-10-CM | POA: Diagnosis not present

## 2017-10-05 DIAGNOSIS — E778 Other disorders of glycoprotein metabolism: Secondary | ICD-10-CM | POA: Diagnosis not present

## 2017-10-12 ENCOUNTER — Ambulatory Visit: Payer: Medicare HMO | Admitting: Cardiology

## 2017-10-12 ENCOUNTER — Encounter: Payer: Self-pay | Admitting: Cardiology

## 2017-10-12 VITALS — BP 122/64 | HR 61 | Ht 73.0 in | Wt 229.0 lb

## 2017-10-12 DIAGNOSIS — Z9989 Dependence on other enabling machines and devices: Secondary | ICD-10-CM

## 2017-10-12 DIAGNOSIS — N184 Chronic kidney disease, stage 4 (severe): Secondary | ICD-10-CM | POA: Diagnosis not present

## 2017-10-12 DIAGNOSIS — I480 Paroxysmal atrial fibrillation: Secondary | ICD-10-CM | POA: Diagnosis not present

## 2017-10-12 DIAGNOSIS — I272 Pulmonary hypertension, unspecified: Secondary | ICD-10-CM

## 2017-10-12 DIAGNOSIS — K7469 Other cirrhosis of liver: Secondary | ICD-10-CM

## 2017-10-12 DIAGNOSIS — R002 Palpitations: Secondary | ICD-10-CM

## 2017-10-12 DIAGNOSIS — I10 Essential (primary) hypertension: Secondary | ICD-10-CM

## 2017-10-12 DIAGNOSIS — E785 Hyperlipidemia, unspecified: Secondary | ICD-10-CM

## 2017-10-12 DIAGNOSIS — G4733 Obstructive sleep apnea (adult) (pediatric): Secondary | ICD-10-CM | POA: Diagnosis not present

## 2017-10-12 DIAGNOSIS — I5032 Chronic diastolic (congestive) heart failure: Secondary | ICD-10-CM

## 2017-10-12 DIAGNOSIS — J42 Unspecified chronic bronchitis: Secondary | ICD-10-CM | POA: Diagnosis not present

## 2017-10-12 DIAGNOSIS — J449 Chronic obstructive pulmonary disease, unspecified: Secondary | ICD-10-CM | POA: Diagnosis not present

## 2017-10-12 NOTE — Progress Notes (Signed)
Cardiology Office Note:    Date:  10/12/2017   ID:  Carlos Heath, DOB 02/22/1966, MRN [id number]  PCP:  Maryella Shivers, MD  Cardiologist:  Jenne Campus, MD    Referring MD: Maryella Shivers, MD   Chief Complaint  Patient presents with  . Atrial Fibrillation  . Tachycardia  I palpitations  History of Present Illness:    Carlos Heath is a 52 y.o. male with very complex past medical history.  He does have history of coronary artery disease, chronic renal failure.  He is being considered for kidney transplant.  Also recently he was diagnosed with fibrosis of the liver.  Also have history of GI bleeding.  He comes to my office today for regular follow-up.  Chief complaint today is the fact that he does have some palpitations.  Apparently last week 1 day he was having a lot of palpitations he was ready to go to hospital however did not do it.  Denies have any chest pain tightness squeezing pressure burning chest.  Complaint of being weak tired exhausted.  Since last week from time to time he will get palpitations about maybe 2-3 times a week.  No dizziness no passing out with those palpitations.  Past Medical History:  Diagnosis Date  . Acute diastolic CHF (congestive heart failure) ([place]) 07/01/2012  . Anemia in stage 4 chronic kidney disease ([place]) 10/16/2016  . Anginal pain ([place])   . Anxiety   . Atrial flutter ([place]) 04/16/2016  . B12 deficiency anemia    "get shot q month" (07/01/2012)  . CAD (coronary artery disease)   . Carotid artery occlusion   . Carpal tunnel syndrome on both sides    "wore braces for awhile" (07/01/2012)  . Chest pain 08/05/2015  . CHF (congestive heart failure) ([place])   . Chronic combined systolic and diastolic congestive heart failure ([place]) 01/08/2016  . Chronic kidney disease (CKD), stage IV (severe) ([place]) 09/11/2011  . Chronic renal failure, stage 4 (severe) ([place]) 01/31/2015  . Complication of anesthesia    difficulty urinating  . Coronary artery disease  involving native coronary artery of native heart without angina pectoris 01/31/2015  . Depression   . Diabetes mellitus ([place]) 07/01/2012  . DVT (deep venous thrombosis) ([place]) 2011   "RLE; after I went home from a cath; loaded me up w/heparin, lovenox" (07/01/2012)  . Dyslipidemia 01/31/2015  . Edema extremities 07/01/2012  . End stage renal disease ([place]) 09/11/2011  . ESRD (end stage renal disease) ([place])    "have 12% function" (07/01/2012)  . Essential hypertension 07/01/2012  . Exertional dyspnea   . GERD (gastroesophageal reflux disease)   . GI bleed 05/11/2017  . Gouty arthritis   . Heart murmur   . History of blood transfusion ?2012   "laceration right wrist; lost lots of blood" (07/01/2012)  . Hyperlipidemia   . Hypertension   . Iron deficiency anemia 2012   "had to get shots q week for awhile" (07/01/2012)  . Migraines    "monthly" (07/01/2012)  . Myocardial infarction [facility]) ~ 2005   "couple they suspect" (07/01/2012)  . Obesity 07/01/2012  . OSA on CPAP 2011  . Other cirrhosis of liver ([place]) 11/17/2016  . Other complications due to renal dialysis device, implant, and graft 11/13/2013  . Paroxysmal atrial fibrillation ([place]) 10/17/2015  . Pre-transplant evaluation for kidney transplant 11/17/2016  . SOB (shortness of breath) 07/01/2012  . Stage 5 chronic kidney disease not on chronic dialysis ([place]) 11/17/2016  . Supraventricular  tachycardia ([place]) 11/26/2015  . Type II diabetes mellitus ([place]) 05/2012    Past Surgical History:  Procedure Laterality Date  . ANTERIOR CERVICAL DECOMP/DISCECTOMY FUSION  ~ 2000   "w/plate" (07/01/2012)  . AV FISTULA PLACEMENT  ?2007   right antecub (07/01/2012)  . CERVICAL SPINE SURGERY    . CORONARY ANGIOPLASTY WITH STENT PLACEMENT  2011 X 3   "1 + 2 + 2; 5 total" (07/01/2012)  . FINGER FRACTURE SURGERY Left   . LACERATION REPAIR  2012   "right wrist; fell thru a glass" (07/01/2012)  . TONSILLECTOMY  1970's  . VEIN SURGERY  03/05/2010   "moved vein  up into arm for dialysis access; was too deep"; right upper arm" (07/01/2012)    Current Medications: Current Meds  Medication Sig  . albuterol (ACCUNEB) 0.63 MG/3ML nebulizer solution Take 1 ampule by nebulization every 6 (six) hours as needed.  . cloNIDine (CATAPRES) 0.2 MG tablet Take 0.2 mg by mouth 2 (two) times daily.  . clopidogrel (PLAVIX) 75 MG tablet Take 75 mg by mouth daily.  . cyanocobalamin (,VITAMIN B-12,) 1000 MCG/ML injection Inject 1 mL into the muscle every 30 (thirty) days.  . Cyclobenzaprine HCl (CYCLOBENZAPRINE 20 TD) Take 20 mg by mouth daily.  Marland Kitchen desvenlafaxine (PRISTIQ) 100 MG 24 hr tablet Take 100 mg by mouth daily.  . febuxostat (ULORIC) 40 MG tablet Take 40 mg by mouth daily.  . Ferrous Sulfate (IRON) 325 (65 Fe) MG TABS Take 1 tablet by mouth 2 (two) times daily.   . furosemide (LASIX) 80 MG tablet Take 160 mg by mouth 2 (two) times daily.  . hydrALAZINE (APRESOLINE) 100 MG tablet Take 100 mg by mouth 2 (two) times daily.  Marland Kitchen labetalol (NORMODYNE) 200 MG tablet Take 200 mg by mouth 2 (two) times daily.  . Melatonin 10 MG TABS Take 10 mg by mouth daily.  . metolazone (ZAROXOLYN) 2.5 MG tablet Take 1 tablet by mouth every other day.   . minoxidil (LONITEN) 10 MG tablet Take 10 mg by mouth 2 (two) times daily.  . nitroGLYCERIN (NITROSTAT) 0.4 MG SL tablet Place 0.4 mg under the tongue as needed for chest pain.  Marland Kitchen omeprazole (PRILOSEC) 40 MG capsule Take 40 mg by mouth daily.  Marland Kitchen oxyCODONE (OXY IR/ROXICODONE) 5 MG immediate release tablet Take 1-2 tablets by mouth every 4 (four) hours as needed for pain.  . potassium chloride SA (K-DUR,KLOR-CON) 20 MEQ tablet Take 5 tablets by mouth 2 (two) times daily.  . predniSONE (DELTASONE) 10 MG tablet Take 30 mg by mouth as needed.  . ranolazine (RANEXA) 1000 MG SR tablet Take 1,000 mg by mouth 2 (two) times daily.  . simvastatin (ZOCOR) 40 MG tablet Take 40 mg by mouth daily.  Marland Kitchen Umeclidinium-Vilanterol (ANORO ELLIPTA IN)  Inhale 1 puff into the lungs daily.  Marland Kitchen zolpidem (AMBIEN) 10 MG tablet Take 1 tablet by mouth at bedtime.     Allergies:   Hydrocodone; Norvasc [amlodipine besylate]; Lisinopril; Amlodipine; Morphine; Prednisone; and Morphine and related   Social History   Socioeconomic History  . Marital status: Married    Spouse name: Not on file  . Number of children: Not on file  . Years of education: Not on file  . Highest education level: Not on file  Occupational History  . Not on file  Social Needs  . Financial resource strain: Not on file  . Food insecurity:    Worry: Not on file    Inability: Not on file  .  Transportation needs:    Medical: Not on file    Non-medical: Not on file  Tobacco Use  . Smoking status: Former Smoker    Packs/day: 1.00    Years: 35.00    Pack years: 35.00    Types: Cigarettes    Last attempt to quit: 01/11/2011    Years since quitting: 6.7  . Smokeless tobacco: Never Used  Substance and Sexual Activity  . Alcohol use: No    Comment: 07/01/2012 "never drank much; occasionally only; last drink ~ 2 yr ago"  . Drug use: No  . Sexual activity: Never  Lifestyle  . Physical activity:    Days per week: Not on file    Minutes per session: Not on file  . Stress: Not on file  Relationships  . Social connections:    Talks on phone: Not on file    Gets together: Not on file    Attends religious service: Not on file    Active member of club or organization: Not on file    Attends meetings of clubs or organizations: Not on file    Relationship status: Not on file  Other Topics Concern  . Not on file  Social History Narrative  . Not on file     Family History: The patient's family history includes Heart attack in his father and mother; Heart disease in his father, mother, and sister; Hyperlipidemia in his father and mother; Hypertension in his brother, father, and mother; Other in his mother; Stroke in his mother. ROS:   Please see the history of present  illness.    All 14 point review of systems negative except as described per history of present illness  EKGs/Labs/Other Studies Reviewed:      Recent Labs: No results found for requested labs within last 8760 hours.  Recent Lipid Panel No results found for: CHOL, TRIG, HDL, CHOLHDL, VLDL, LDLCALC, LDLDIRECT  Physical Exam:    VS:  BP 122/64   Pulse 61   Ht 6\' 1"  (1.854 m)   Wt 229 lb (103.9 kg)   SpO2 98%   BMI 30.21 kg/m     Wt Readings from Last 3 Encounters:  10/12/17 229 lb (103.9 kg)  05/14/17 224 lb 6.4 oz (101.8 kg)  11/13/13 224 lb 14.4 oz (102 kg)     GEN:  Well nourished, well developed in no acute distress HEENT: Normal NECK: No JVD; No carotid bruits LYMPHATICS: No lymphadenopathy CARDIAC: RRR, systolic murmur grade 2/6 to 3/6 best heard left border of the sternum and right upper portion of the sternum, no rubs, no gallops RESPIRATORY:  Clear to auscultation without rales, wheezing or rhonchi  ABDOMEN: Soft, non-tender, non-distended MUSCULOSKELETAL:  No edema; No deformity  SKIN: Warm and dry LOWER EXTREMITIES: no swelling NEUROLOGIC:  Alert and oriented x 3 PSYCHIATRIC:  Normal affect   ASSESSMENT:    1. Essential hypertension   2. OSA on CPAP   3. Other cirrhosis of liver ([place])   4. Chronic kidney disease (CKD), stage IV (severe) ([place])   5. Dyslipidemia   6. Palpitations    PLAN:    In order of problems listed above:  1. Essential hypertension: Blood pressure appears to be well controlled we will continue present management. 2. Pulmonary hypertension: I reviewed echocardiogram done at the end of last year at [facility] his pulmonary artery pressure was elevated significantly measuring 75 mmHg.  Wedge of this phenomenon is unclear to me at the moment.  However he does  have significant history of structural sleep apnea (CPAP mask: Also history of chronic liver problem.  In my opinion he needs to be referred of pulmonary hypertension clinic.  We will  talk to him what will be his choice in terms of place that he would like to go and be seen. 3. Paroxysmal atrial fibrillation: He does report to have some palpitations.  I will ask him to wear event recorder.  Previously he was not anticoagulated because of chads 2 Vascor equals 3 however he developed GI bleeding that required blood transfusion anticoagulation was withdrawn.  He started having conversation about potentially implanting watchman device but it looks like that conversation did not continue.  Talk to him again about potentially having watchman device implanted. 4. Chronic kidney disease: Followed by nephrology.  Conversation about transplantation that has been initiated.  He did have a lot of testing done to proceed that way. 5. Dyslipidemia: We will continue with statins  Overall is a very complex situation.  Somewhat fragmented care.  Will refer him to pulmonary hypertension clinic: I will put event recorder on to see if there is still episode of atrial fibrillation will initiate conversation about watchman device.   Medication Adjustments/Labs and Tests Ordered: Current medicines are reviewed at length with the patient today.  Concerns regarding medicines are outlined above.  No orders of the defined types were placed in this encounter.  Medication changes: No orders of the defined types were placed in this encounter.   Signed, Park Liter, MD, [facility] 10/12/2017 11:18 AM    [place]

## 2017-10-12 NOTE — Patient Instructions (Addendum)
Medication Instructions:  Your physician recommends that you continue on your current medications as directed. Please refer to the Current Medication list given to you today.   Labwork: None  Testing/Procedures: You had an EKG today.   Your physician has recommended that you wear a holter monitor. Holter monitors are medical devices that record the heart's electrical activity. Doctors most often use these monitors to diagnose arrhythmias. Arrhythmias are problems with the speed or rhythm of the heartbeat. The monitor is a small, portable device. You can wear one while you do your normal daily activities. This is usually used to diagnose what is causing palpitations/syncope (passing out). Wear for 7 days.  Follow-Up: Your physician recommends that you schedule a follow-up appointment in: 3 months.  Any Other Special Instructions Will Be Listed Below (If Applicable).     If you need a refill on your cardiac medications before your next appointment, please call your pharmacy.

## 2017-10-13 ENCOUNTER — Telehealth: Payer: Self-pay | Admitting: *Deleted

## 2017-10-13 ENCOUNTER — Telehealth: Payer: Self-pay | Admitting: Cardiology

## 2017-10-13 DIAGNOSIS — I272 Pulmonary hypertension, unspecified: Secondary | ICD-10-CM

## 2017-10-13 NOTE — Telephone Encounter (Signed)
7 day long-term holter monitor changed to 30 day event monitor per Dr. Wendy Poet request. Also, need to clarify if patient is seeing pulmonologist. If he isn't, Dr. Agustin Cree wants him referred to a pulmonologist. Left message for patient to return call.

## 2017-10-13 NOTE — Telephone Encounter (Signed)
Carlos Heath returned your call and is asking for a call back.

## 2017-10-13 NOTE — Telephone Encounter (Signed)
Correction: Dr. Agustin Cree decided he wanted a referral to Dr. Haroldine Laws with CHF clinic not pulmonology. Correction made.

## 2017-10-13 NOTE — Telephone Encounter (Signed)
Patient states that he is not currently seeing a pulmonologist, so a referral to [facility] Pulmonology placed. Informed patient that he will be contacted to schedule appointment. Informed patient of changes with monitor. Patient verbalized understanding. No further questions.

## 2017-10-13 NOTE — Telephone Encounter (Signed)
Patient states that he is not currently seeing a pulmonologist, so a referral to [facility] placed. Informed patient that he will be contacted to schedule appointment. Informed patient of changes with monitor. Patient verbalized understanding. No further questions.

## 2017-10-13 NOTE — Addendum Note (Signed)
Addended by: Austin Miles on: 10/13/2017 11:57 AM   Modules accepted: Orders

## 2017-10-19 ENCOUNTER — Ambulatory Visit: Payer: Medicare HMO

## 2017-10-19 DIAGNOSIS — I480 Paroxysmal atrial fibrillation: Secondary | ICD-10-CM | POA: Diagnosis not present

## 2017-10-19 DIAGNOSIS — R002 Palpitations: Secondary | ICD-10-CM | POA: Diagnosis not present

## 2017-10-26 DIAGNOSIS — E1129 Type 2 diabetes mellitus with other diabetic kidney complication: Secondary | ICD-10-CM | POA: Diagnosis not present

## 2017-10-26 DIAGNOSIS — I129 Hypertensive chronic kidney disease with stage 1 through stage 4 chronic kidney disease, or unspecified chronic kidney disease: Secondary | ICD-10-CM | POA: Diagnosis not present

## 2017-10-26 DIAGNOSIS — N2581 Secondary hyperparathyroidism of renal origin: Secondary | ICD-10-CM | POA: Diagnosis not present

## 2017-10-26 DIAGNOSIS — D631 Anemia in chronic kidney disease: Secondary | ICD-10-CM | POA: Diagnosis not present

## 2017-10-26 DIAGNOSIS — Z6832 Body mass index (BMI) 32.0-32.9, adult: Secondary | ICD-10-CM | POA: Diagnosis not present

## 2017-10-26 DIAGNOSIS — N184 Chronic kidney disease, stage 4 (severe): Secondary | ICD-10-CM | POA: Diagnosis not present

## 2017-11-02 DIAGNOSIS — K7460 Unspecified cirrhosis of liver: Secondary | ICD-10-CM | POA: Diagnosis not present

## 2017-11-02 DIAGNOSIS — K219 Gastro-esophageal reflux disease without esophagitis: Secondary | ICD-10-CM | POA: Diagnosis not present

## 2017-11-02 DIAGNOSIS — R195 Other fecal abnormalities: Secondary | ICD-10-CM | POA: Diagnosis not present

## 2017-11-02 DIAGNOSIS — D500 Iron deficiency anemia secondary to blood loss (chronic): Secondary | ICD-10-CM | POA: Diagnosis not present

## 2017-11-11 DIAGNOSIS — I10 Essential (primary) hypertension: Secondary | ICD-10-CM | POA: Diagnosis not present

## 2017-11-11 DIAGNOSIS — J42 Unspecified chronic bronchitis: Secondary | ICD-10-CM | POA: Diagnosis not present

## 2017-11-11 DIAGNOSIS — M47892 Other spondylosis, cervical region: Secondary | ICD-10-CM | POA: Diagnosis not present

## 2017-11-11 DIAGNOSIS — J449 Chronic obstructive pulmonary disease, unspecified: Secondary | ICD-10-CM | POA: Diagnosis not present

## 2017-11-11 DIAGNOSIS — Z6835 Body mass index (BMI) 35.0-35.9, adult: Secondary | ICD-10-CM | POA: Diagnosis not present

## 2017-11-19 ENCOUNTER — Other Ambulatory Visit: Payer: Self-pay | Admitting: Neurosurgery

## 2017-11-19 DIAGNOSIS — M47892 Other spondylosis, cervical region: Secondary | ICD-10-CM

## 2017-11-26 ENCOUNTER — Ambulatory Visit
Admission: RE | Admit: 2017-11-26 | Discharge: 2017-11-26 | Disposition: A | Discharge status: Home / Self Care | Payer: Medicare HMO | Source: Ambulatory Visit | Attending: Neurosurgery | Admitting: Neurosurgery

## 2017-11-26 DIAGNOSIS — M47892 Other spondylosis, cervical region: Secondary | ICD-10-CM

## 2017-11-26 DIAGNOSIS — M4802 Spinal stenosis, cervical region: Secondary | ICD-10-CM | POA: Diagnosis not present

## 2017-11-26 DIAGNOSIS — M4322 Fusion of spine, cervical region: Secondary | ICD-10-CM | POA: Diagnosis not present

## 2017-11-26 DIAGNOSIS — M5031 Other cervical disc degeneration,  high cervical region: Secondary | ICD-10-CM | POA: Diagnosis not present

## 2017-11-26 DIAGNOSIS — M47812 Spondylosis without myelopathy or radiculopathy, cervical region: Secondary | ICD-10-CM | POA: Diagnosis not present

## 2017-12-02 DIAGNOSIS — M47892 Other spondylosis, cervical region: Secondary | ICD-10-CM | POA: Diagnosis not present

## 2017-12-02 DIAGNOSIS — Z6835 Body mass index (BMI) 35.0-35.9, adult: Secondary | ICD-10-CM | POA: Diagnosis not present

## 2017-12-10 ENCOUNTER — Encounter (HOSPITAL_COMMUNITY): Payer: Self-pay | Admitting: Internal Medicine

## 2017-12-10 ENCOUNTER — Encounter (HOSPITAL_COMMUNITY): Payer: Self-pay | Admitting: *Deleted

## 2017-12-10 ENCOUNTER — Ambulatory Visit (HOSPITAL_COMMUNITY)
Admission: RE | Admit: 2017-12-10 | Discharge: 2017-12-10 | Disposition: A | Discharge status: Home / Self Care | Payer: Medicare HMO | Source: Ambulatory Visit | Attending: Internal Medicine | Admitting: Internal Medicine

## 2017-12-10 VITALS — BP 110/56 | HR 61 | Wt 229.2 lb

## 2017-12-10 DIAGNOSIS — I132 Hypertensive heart and chronic kidney disease with heart failure and with stage 5 chronic kidney disease, or end stage renal disease: Secondary | ICD-10-CM | POA: Insufficient documentation

## 2017-12-10 DIAGNOSIS — K7460 Unspecified cirrhosis of liver: Secondary | ICD-10-CM | POA: Insufficient documentation

## 2017-12-10 DIAGNOSIS — I10 Essential (primary) hypertension: Secondary | ICD-10-CM

## 2017-12-10 DIAGNOSIS — Z9889 Other specified postprocedural states: Secondary | ICD-10-CM | POA: Insufficient documentation

## 2017-12-10 DIAGNOSIS — I480 Paroxysmal atrial fibrillation: Secondary | ICD-10-CM

## 2017-12-10 DIAGNOSIS — K219 Gastro-esophageal reflux disease without esophagitis: Secondary | ICD-10-CM | POA: Diagnosis not present

## 2017-12-10 DIAGNOSIS — N186 End stage renal disease: Secondary | ICD-10-CM | POA: Diagnosis not present

## 2017-12-10 DIAGNOSIS — Z885 Allergy status to narcotic agent status: Secondary | ICD-10-CM | POA: Insufficient documentation

## 2017-12-10 DIAGNOSIS — I2510 Atherosclerotic heart disease of native coronary artery without angina pectoris: Secondary | ICD-10-CM

## 2017-12-10 DIAGNOSIS — I272 Pulmonary hypertension, unspecified: Secondary | ICD-10-CM

## 2017-12-10 DIAGNOSIS — N184 Chronic kidney disease, stage 4 (severe): Secondary | ICD-10-CM

## 2017-12-10 DIAGNOSIS — E1122 Type 2 diabetes mellitus with diabetic chronic kidney disease: Secondary | ICD-10-CM | POA: Insufficient documentation

## 2017-12-10 DIAGNOSIS — E785 Hyperlipidemia, unspecified: Secondary | ICD-10-CM | POA: Insufficient documentation

## 2017-12-10 DIAGNOSIS — D631 Anemia in chronic kidney disease: Secondary | ICD-10-CM | POA: Diagnosis not present

## 2017-12-10 DIAGNOSIS — Z888 Allergy status to other drugs, medicaments and biological substances status: Secondary | ICD-10-CM | POA: Diagnosis not present

## 2017-12-10 DIAGNOSIS — Z79899 Other long term (current) drug therapy: Secondary | ICD-10-CM | POA: Insufficient documentation

## 2017-12-10 DIAGNOSIS — I5042 Chronic combined systolic (congestive) and diastolic (congestive) heart failure: Secondary | ICD-10-CM | POA: Diagnosis not present

## 2017-12-10 DIAGNOSIS — G4733 Obstructive sleep apnea (adult) (pediatric): Secondary | ICD-10-CM

## 2017-12-10 DIAGNOSIS — Z87891 Personal history of nicotine dependence: Secondary | ICD-10-CM | POA: Diagnosis not present

## 2017-12-10 DIAGNOSIS — Z9989 Dependence on other enabling machines and devices: Secondary | ICD-10-CM

## 2017-12-10 LAB — BASIC METABOLIC PANEL
Anion gap: 14 (ref 5–15)
BUN: 65 mg/dL — AB (ref 6–20)
CALCIUM: 9.3 mg/dL (ref 8.9–10.3)
CO2: 23 mmol/L (ref 22–32)
Chloride: 102 mmol/L (ref 101–111)
Creatinine, Ser: 4.72 mg/dL — ABNORMAL HIGH (ref 0.61–1.24)
GFR calc Af Amer: 15 mL/min — ABNORMAL LOW (ref >60)
GFR, EST NON AFRICAN AMERICAN: 13 mL/min — AB (ref >60)
Glucose, Bld: 120 mg/dL — ABNORMAL HIGH (ref 65–99)
POTASSIUM: 3.3 mmol/L — AB (ref 3.5–5.1)
Sodium: 139 mmol/L (ref 135–145)

## 2017-12-10 LAB — CBC
HEMATOCRIT: 40.1 % (ref 39.0–52.0)
Hemoglobin: 13.4 g/dL (ref 13.0–17.0)
MCH: 27.9 pg (ref 26.0–34.0)
MCHC: 33.4 g/dL (ref 30.0–36.0)
MCV: 83.4 fL (ref 78.0–100.0)
Platelets: 168 10*3/uL (ref 150–400)
RBC: 4.81 MIL/uL (ref 4.22–5.81)
RDW: 14.5 % (ref 11.5–15.5)
WBC: 6.6 10*3/uL (ref 4.0–10.5)

## 2017-12-10 NOTE — Patient Instructions (Signed)
Chest x-ray and VQ Scan on Friday 12/17/17 please arrive to Radiology Department at 8:30 AM for these test  Right Heart Catheterization on Friday 12/17/17, see instruction sheet  You have been referred to Dr Alcide Clever   Your physician recommends that you schedule a follow-up appointment in: 4 months

## 2017-12-10 NOTE — H&P (View-Only) (Signed)
ADVANCED HF CLINIC CONSULT NOTE   Date:  12/10/2017   ID:  Carlos Heath, DOB 28-Mar-1966, MRN [id number]  PCP:  Maryella Shivers, MD  Cardiologist:  Dr. Agustin Cree   Referring MD: Agustin Cree   History of Present Illness:    Carlos Heath is a 52 y.o. male with h/o severe HTN, CKD IV (last creatinine 4.5) followed by Dr. Justin Mend, AVF in place since 2008, mild cirrhosis with liver biopsy at [facility], CAD with multiple stents at [facility] last 2012 - had HD at the time to "clean dye out", mini-strokes and PAF. He is referred to the HF Clinic by Dr. Drue Dun for further evaluation of pulmonary HTN on echo.   He is here with his wife. 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 Says he doesn't do much of anything. Can do ADLs without too much problem. Gets SOB if does anything more. Occasional CP but no change. Occasional dizziness and palpitations. No syncope. Struggling with AF. Just wore a monitor and is awaiting results. + edema. Takes lasix 160 bid.  Takes metolazone qod to control it.  Denies h/o known PE. Wears CPAP every night. LE u/s 6/17 negative. Has not had VQ. Stopped smoking last year. Prior to that up to 1 ppd.   Recent studies reviewed personally through Epic and Care Everywhere:  Echo 10/18 EF 60-65% Normal RV Moderate TR. RVSP 76 Liver u/s 2/19 + cirrhosis no portal HTN  CT chest 3/19 [facility]):  Several small nodules. No significant COPD    Past Medical History:  Diagnosis Date  . Acute diastolic CHF (congestive heart failure) ([place]) 07/01/2012  . Anemia in stage 4 chronic kidney disease ([place]) 10/16/2016  . Anginal pain ([place])   . Anxiety   . Atrial flutter ([place]) 04/16/2016  . B12 deficiency anemia    "get shot q month" (07/01/2012)  . CAD (coronary artery disease)   . Carotid artery occlusion   . Carpal tunnel syndrome on both sides    "wore braces for awhile" (07/01/2012)  . Chest pain 08/05/2015  . CHF (congestive heart  failure) ([place])   . Chronic combined systolic and diastolic congestive heart failure ([place]) 01/08/2016  . Chronic kidney disease (CKD), stage IV (severe) ([place]) 09/11/2011  . Chronic renal failure, stage 4 (severe) ([place]) 01/31/2015  . Complication of anesthesia    difficulty urinating  . Coronary artery disease involving native coronary artery of native heart without angina pectoris 01/31/2015  . Depression   . Diabetes mellitus ([place]) 07/01/2012  . DVT (deep venous thrombosis) ([place]) 2011   "RLE; after I went home from a cath; loaded me up w/heparin, lovenox" (07/01/2012)  . Dyslipidemia 01/31/2015  . Edema extremities 07/01/2012  . End stage renal disease ([place]) 09/11/2011  . ESRD (end stage renal disease) ([place])    "have 12% function" (07/01/2012)  . Essential hypertension 07/01/2012  . Exertional dyspnea   . GERD (gastroesophageal reflux disease)   . GI bleed 05/11/2017  . Gouty arthritis   . Heart murmur   . History of blood transfusion ?2012   "laceration right wrist; lost lots of blood" (07/01/2012)  . Hyperlipidemia   . Hypertension   . Iron deficiency anemia 2012   "had to get shots q week for awhile" (07/01/2012)  . Migraines    "monthly" (07/01/2012)  . Myocardial infarction [facility]) ~ 2005   "couple they suspect" (07/01/2012)  . Obesity 07/01/2012  .  OSA on CPAP 2011  . Other cirrhosis of liver ([place]) 11/17/2016  . Other complications due to renal dialysis device, implant, and graft 11/13/2013  . Paroxysmal atrial fibrillation ([place]) 10/17/2015  . Pre-transplant evaluation for kidney transplant 11/17/2016  . SOB (shortness of breath) 07/01/2012  . Stage 5 chronic kidney disease not on chronic dialysis ([place]) 11/17/2016  . Supraventricular tachycardia ([place]) 11/26/2015  . Type II diabetes mellitus ([place]) 05/2012    Past Surgical History:  Procedure Laterality Date  . ANTERIOR CERVICAL DECOMP/DISCECTOMY FUSION  ~ 2000   "w/plate" (07/01/2012)  . AV FISTULA PLACEMENT  ?2007   right antecub  (07/01/2012)  . CERVICAL SPINE SURGERY    . CORONARY ANGIOPLASTY WITH STENT PLACEMENT  2011 X 3   "1 + 2 + 2; 5 total" (07/01/2012)  . FINGER FRACTURE SURGERY Left   . LACERATION REPAIR  2012   "right wrist; fell thru a glass" (07/01/2012)  . TONSILLECTOMY  1970's  . VEIN SURGERY  03/05/2010   "moved vein up into arm for dialysis access; was too deep"; right upper arm" (07/01/2012)    Current Medications: Current Meds  Medication Sig  . albuterol (ACCUNEB) 0.63 MG/3ML nebulizer solution Take 1 ampule by nebulization every 6 (six) hours as needed.  . calcitRIOL (ROCALTROL) 0.25 MCG capsule Take 0.25 mcg by mouth daily.  . cloNIDine (CATAPRES) 0.2 MG tablet Take 0.2 mg by mouth 2 (two) times daily.  . clopidogrel (PLAVIX) 75 MG tablet Take 75 mg by mouth daily.  . cyanocobalamin (,VITAMIN B-12,) 1000 MCG/ML injection Inject 1 mL into the muscle every 30 (thirty) days.  Marland Kitchen desvenlafaxine (PRISTIQ) 100 MG 24 hr tablet Take 100 mg by mouth daily.  . febuxostat (ULORIC) 40 MG tablet Take 40 mg by mouth daily.  . Ferrous Sulfate (IRON) 325 (65 Fe) MG TABS Take 1 tablet by mouth 2 (two) times daily.   . furosemide (LASIX) 80 MG tablet Take 160 mg by mouth 2 (two) times daily.  . hydrALAZINE (APRESOLINE) 100 MG tablet Take 100 mg by mouth 2 (two) times daily.  Marland Kitchen labetalol (NORMODYNE) 300 MG tablet Take 150 mg by mouth 2 (two) times daily.  . Melatonin 10 MG TABS Take 10 mg by mouth daily.  . metolazone (ZAROXOLYN) 2.5 MG tablet Take 1 tablet by mouth every other day.   . minoxidil (LONITEN) 10 MG tablet Take 10 mg by mouth 2 (two) times daily.  . nitroGLYCERIN (NITROSTAT) 0.4 MG SL tablet Place 0.4 mg under the tongue as needed for chest pain.  Marland Kitchen omeprazole (PRILOSEC) 40 MG capsule Take 40 mg by mouth daily.  Marland Kitchen oxyCODONE (OXY IR/ROXICODONE) 5 MG immediate release tablet Take 1-2 tablets by mouth every 4 (four) hours as needed for pain.  . potassium chloride SA (K-DUR,KLOR-CON) 20 MEQ tablet Take  5 tablets by mouth 2 (two) times daily.  . predniSONE (DELTASONE) 10 MG tablet Take 30 mg by mouth as needed.  . ranolazine (RANEXA) 1000 MG SR tablet Take 1,000 mg by mouth 2 (two) times daily.  . simvastatin (ZOCOR) 40 MG tablet Take 40 mg by mouth daily.  Marland Kitchen Umeclidinium-Vilanterol (ANORO ELLIPTA IN) Inhale 1 puff into the lungs daily.  Marland Kitchen zolpidem (AMBIEN) 10 MG tablet Take 1 tablet by mouth at bedtime.     Allergies:   Hydrocodone; Norvasc [amlodipine besylate]; Lisinopril; Amlodipine; Morphine; Prednisone; and Morphine and related   Social History   Socioeconomic History  . Marital status: Married    Spouse name: Not  on file  . Number of children: Not on file  . Years of education: Not on file  . Highest education level: Not on file  Occupational History  . Not on file  Social Needs  . Financial resource strain: Not on file  . Food insecurity:    Worry: Not on file    Inability: Not on file  . Transportation needs:    Medical: Not on file    Non-medical: Not on file  Tobacco Use  . Smoking status: Former Smoker    Packs/day: 1.00    Years: 35.00    Pack years: 35.00    Types: Cigarettes    Last attempt to quit: 01/11/2011    Years since quitting: 6.9  . Smokeless tobacco: Never Used  Substance and Sexual Activity  . Alcohol use: No    Comment: 07/01/2012 "never drank much; occasionally only; last drink ~ 2 yr ago"  . Drug use: No  . Sexual activity: Never  Lifestyle  . Physical activity:    Days per week: Not on file    Minutes per session: Not on file  . Stress: Not on file  Relationships  . Social connections:    Talks on phone: Not on file    Gets together: Not on file    Attends religious service: Not on file    Active member of club or organization: Not on file    Attends meetings of clubs or organizations: Not on file    Relationship status: Not on file  Other Topics Concern  . Not on file  Social History Narrative  . Not on file     Family  History: The patient's family history includes Heart attack in his father and mother; Heart disease in his father, mother, and sister; Hyperlipidemia in his father and mother; Hypertension in his brother, father, and mother; Other in his mother; Stroke in his mother. ROS:   Please see the history of present illness.    All 14 point review of systems negative except as described per history of present illness  EKGs/Labs/Other Studies Reviewed:    Recent Labs: No results found for requested labs within last 8760 hours.  Recent Lipid Panel No results found for: CHOL, TRIG, HDL, CHOLHDL, VLDL, LDLCALC, LDLDIRECT  Physical Exam:    VS:  BP (!) 110/56   Pulse 61   Wt 229 lb 4 oz (104 kg)   SpO2 97%   BMI 30.25 kg/m     Wt Readings from Last 3 Encounters:  12/10/17 229 lb 4 oz (104 kg)  10/12/17 229 lb (103.9 kg)  05/14/17 224 lb 6.4 oz (101.8 kg)    General:  Well appearing. No resp difficulty HEENT: normal Neck: supple. no JVD. Carotids 2+ bilat; no bruits. No lymphadenopathy or thryomegaly appreciated. Cor: PMI nondisplaced. Regular rate & rhythm. 2/6 SEM at LUSB Lungs: clear Abdomen: soft, nontender, nondistended. No hepatosplenomegaly. No bruits or masses. Good bowel sounds. Extremities: no cyanosis, clubbing, rash, edema  Normal-size AVF in RAC fossa Neuro: alert & orientedx3, cranial nerves grossly intact. moves all 4 extremities w/o difficulty. Affect appears frustrated  ASSESSMENT/Plan:    1. Pulmonary HTN by echo - I suspect this is multifactorial due to diastolic dysfunction (WHO Group II), lung disease/OSA (WHO Group III) and probable high-output physiology from AVF and cirrhosis. Doubt component of WHO Group I disease which would be responsive to selective pulmonary artery vasodilators. - Will check VQ to assess for CTEPH - Refer to Dr.  Chodri in [place] for PFTs and CPAP titration - Plan RHC to further assess  2. CAD - seems to have stable angina which is  well-controlled. Managed per Dr. Arn Medal.  - can consider CR  3. PAF - Followed by Dr. Agustin Cree. Have been discussing Watchman device.   4. CKD IV - AVF in place. Managed by Dr. Justin Mend  5. HTN, severe - Currently well controlled on multiple agents.  6. OSA - Continue CPAP - Settings have not been checked in a long time. Refer to Dr. Alcide Clever for CPAP titration to ensure adequate support  Glori Bickers, MD  12:26 PM

## 2017-12-10 NOTE — Progress Notes (Signed)
ADVANCED HF CLINIC CONSULT NOTE   Date:  12/10/2017   ID:  ROBT OKUDA, DOB 03-14-1966, MRN [id number]  PCP:  Maryella Shivers, MD  Cardiologist:  Dr. Agustin Cree   Referring MD: Agustin Cree   History of Present Illness:    Carlos Heath is a 52 y.o. male with h/o severe HTN, CKD IV (last creatinine 4.5) followed by Dr. Justin Mend, AVF in place since 2008, mild cirrhosis with liver biopsy at [facility], CAD with multiple stents at [facility] last 2012 - had HD at the time to "clean dye out", mini-strokes and PAF. He is referred to the HF Clinic by Dr. Drue Dun for further evaluation of pulmonary HTN on echo.   He is here with his wife. 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 Says he doesn't do much of anything. Can do ADLs without too much problem. Gets SOB if does anything more. Occasional CP but no change. Occasional dizziness and palpitations. No syncope. Struggling with AF. Just wore a monitor and is awaiting results. + edema. Takes lasix 160 bid.  Takes metolazone qod to control it.  Denies h/o known PE. Wears CPAP every night. LE u/s 6/17 negative. Has not had VQ. Stopped smoking last year. Prior to that up to 1 ppd.   Recent studies reviewed personally through Epic and Care Everywhere:  Echo 10/18 EF 60-65% Normal RV Moderate TR. RVSP 76 Liver u/s 2/19 + cirrhosis no portal HTN  CT chest 3/19 [facility]):  Several small nodules. No significant COPD    Past Medical History:  Diagnosis Date  . Acute diastolic CHF (congestive heart failure) ([place]) 07/01/2012  . Anemia in stage 4 chronic kidney disease ([place]) 10/16/2016  . Anginal pain ([place])   . Anxiety   . Atrial flutter ([place]) 04/16/2016  . B12 deficiency anemia    "get shot q month" (07/01/2012)  . CAD (coronary artery disease)   . Carotid artery occlusion   . Carpal tunnel syndrome on both sides    "wore braces for awhile" (07/01/2012)  . Chest pain 08/05/2015  . CHF (congestive heart  failure) ([employer])   . Chronic combined systolic and diastolic congestive heart failure ([place]) 01/08/2016  . Chronic kidney disease (CKD), stage IV (severe) ([place]) 09/11/2011  . Chronic renal failure, stage 4 (severe) ([place]) 01/31/2015  . Complication of anesthesia    difficulty urinating  . Coronary artery disease involving native coronary artery of native heart without angina pectoris 01/31/2015  . Depression   . Diabetes mellitus ([place]) 07/01/2012  . DVT (deep venous thrombosis) ([place]) 2011   "RLE; after I went home from a cath; loaded me up w/heparin, lovenox" (07/01/2012)  . Dyslipidemia 01/31/2015  . Edema extremities 07/01/2012  . End stage renal disease ([employer]) 09/11/2011  . ESRD (end stage renal disease) ([place])    "have 12% function" (07/01/2012)  . Essential hypertension 07/01/2012  . Exertional dyspnea   . GERD (gastroesophageal reflux disease)   . GI bleed 05/11/2017  . Gouty arthritis   . Heart murmur   . History of blood transfusion ?2012   "laceration right wrist; lost lots of blood" (07/01/2012)  . Hyperlipidemia   . Hypertension   . Iron deficiency anemia 2012   "had to get shots q week for awhile" (07/01/2012)  . Migraines    "monthly" (07/01/2012)  . Myocardial infarction [facility]) ~ 2005   "couple they suspect" (07/01/2012)  . Obesity 07/01/2012  .  OSA on CPAP 2011  . Other cirrhosis of liver ([place]) 11/17/2016  . Other complications due to renal dialysis device, implant, and graft 11/13/2013  . Paroxysmal atrial fibrillation ([place]) 10/17/2015  . Pre-transplant evaluation for kidney transplant 11/17/2016  . SOB (shortness of breath) 07/01/2012  . Stage 5 chronic kidney disease not on chronic dialysis ([place]) 11/17/2016  . Supraventricular tachycardia ([place]) 11/26/2015  . Type II diabetes mellitus ([place]) 05/2012    Past Surgical History:  Procedure Laterality Date  . ANTERIOR CERVICAL DECOMP/DISCECTOMY FUSION  ~ 2000   "w/plate" (07/01/2012)  . AV FISTULA PLACEMENT  ?2007   right antecub  (07/01/2012)  . CERVICAL SPINE SURGERY    . CORONARY ANGIOPLASTY WITH STENT PLACEMENT  2011 X 3   "1 + 2 + 2; 5 total" (07/01/2012)  . FINGER FRACTURE SURGERY Left   . LACERATION REPAIR  2012   "right wrist; fell thru a glass" (07/01/2012)  . TONSILLECTOMY  1970's  . VEIN SURGERY  03/05/2010   "moved vein up into arm for dialysis access; was too deep"; right upper arm" (07/01/2012)    Current Medications: Current Meds  Medication Sig  . albuterol (ACCUNEB) 0.63 MG/3ML nebulizer solution Take 1 ampule by nebulization every 6 (six) hours as needed.  . calcitRIOL (ROCALTROL) 0.25 MCG capsule Take 0.25 mcg by mouth daily.  . cloNIDine (CATAPRES) 0.2 MG tablet Take 0.2 mg by mouth 2 (two) times daily.  . clopidogrel (PLAVIX) 75 MG tablet Take 75 mg by mouth daily.  . cyanocobalamin (,VITAMIN B-12,) 1000 MCG/ML injection Inject 1 mL into the muscle every 30 (thirty) days.  Marland Kitchen desvenlafaxine (PRISTIQ) 100 MG 24 hr tablet Take 100 mg by mouth daily.  . febuxostat (ULORIC) 40 MG tablet Take 40 mg by mouth daily.  . Ferrous Sulfate (IRON) 325 (65 Fe) MG TABS Take 1 tablet by mouth 2 (two) times daily.   . furosemide (LASIX) 80 MG tablet Take 160 mg by mouth 2 (two) times daily.  . hydrALAZINE (APRESOLINE) 100 MG tablet Take 100 mg by mouth 2 (two) times daily.  Marland Kitchen labetalol (NORMODYNE) 300 MG tablet Take 150 mg by mouth 2 (two) times daily.  . Melatonin 10 MG TABS Take 10 mg by mouth daily.  . metolazone (ZAROXOLYN) 2.5 MG tablet Take 1 tablet by mouth every other day.   . minoxidil (LONITEN) 10 MG tablet Take 10 mg by mouth 2 (two) times daily.  . nitroGLYCERIN (NITROSTAT) 0.4 MG SL tablet Place 0.4 mg under the tongue as needed for chest pain.  Marland Kitchen omeprazole (PRILOSEC) 40 MG capsule Take 40 mg by mouth daily.  Marland Kitchen oxyCODONE (OXY IR/ROXICODONE) 5 MG immediate release tablet Take 1-2 tablets by mouth every 4 (four) hours as needed for pain.  . potassium chloride SA (K-DUR,KLOR-CON) 20 MEQ tablet Take  5 tablets by mouth 2 (two) times daily.  . predniSONE (DELTASONE) 10 MG tablet Take 30 mg by mouth as needed.  . ranolazine (RANEXA) 1000 MG SR tablet Take 1,000 mg by mouth 2 (two) times daily.  . simvastatin (ZOCOR) 40 MG tablet Take 40 mg by mouth daily.  Marland Kitchen Umeclidinium-Vilanterol (ANORO ELLIPTA IN) Inhale 1 puff into the lungs daily.  Marland Kitchen zolpidem (AMBIEN) 10 MG tablet Take 1 tablet by mouth at bedtime.     Allergies:   Hydrocodone; Norvasc [amlodipine besylate]; Lisinopril; Amlodipine; Morphine; Prednisone; and Morphine and related   Social History   Socioeconomic History  . Marital status: Married    Spouse name: Not  on file  . Number of children: Not on file  . Years of education: Not on file  . Highest education level: Not on file  Occupational History  . Not on file  Social Needs  . Financial resource strain: Not on file  . Food insecurity:    Worry: Not on file    Inability: Not on file  . Transportation needs:    Medical: Not on file    Non-medical: Not on file  Tobacco Use  . Smoking status: Former Smoker    Packs/day: 1.00    Years: 35.00    Pack years: 35.00    Types: Cigarettes    Last attempt to quit: 01/11/2011    Years since quitting: 6.9  . Smokeless tobacco: Never Used  Substance and Sexual Activity  . Alcohol use: No    Comment: 07/01/2012 "never drank much; occasionally only; last drink ~ 2 yr ago"  . Drug use: No  . Sexual activity: Never  Lifestyle  . Physical activity:    Days per week: Not on file    Minutes per session: Not on file  . Stress: Not on file  Relationships  . Social connections:    Talks on phone: Not on file    Gets together: Not on file    Attends religious service: Not on file    Active member of club or organization: Not on file    Attends meetings of clubs or organizations: Not on file    Relationship status: Not on file  Other Topics Concern  . Not on file  Social History Narrative  . Not on file     Family  History: The patient's family history includes Heart attack in his father and mother; Heart disease in his father, mother, and sister; Hyperlipidemia in his father and mother; Hypertension in his brother, father, and mother; Other in his mother; Stroke in his mother. ROS:   Please see the history of present illness.    All 14 point review of systems negative except as described per history of present illness  EKGs/Labs/Other Studies Reviewed:    Recent Labs: No results found for requested labs within last 8760 hours.  Recent Lipid Panel No results found for: CHOL, TRIG, HDL, CHOLHDL, VLDL, LDLCALC, LDLDIRECT  Physical Exam:    VS:  BP (!) 110/56   Pulse 61   Wt 229 lb 4 oz (104 kg)   SpO2 97%   BMI 30.25 kg/m     Wt Readings from Last 3 Encounters:  12/10/17 229 lb 4 oz (104 kg)  10/12/17 229 lb (103.9 kg)  05/14/17 224 lb 6.4 oz (101.8 kg)    General:  Well appearing. No resp difficulty HEENT: normal Neck: supple. no JVD. Carotids 2+ bilat; no bruits. No lymphadenopathy or thryomegaly appreciated. Cor: PMI nondisplaced. Regular rate & rhythm. 2/6 SEM at LUSB Lungs: clear Abdomen: soft, nontender, nondistended. No hepatosplenomegaly. No bruits or masses. Good bowel sounds. Extremities: no cyanosis, clubbing, rash, edema  Normal-size AVF in RAC fossa Neuro: alert & orientedx3, cranial nerves grossly intact. moves all 4 extremities w/o difficulty. Affect appears frustrated  ASSESSMENT/Plan:    1. Pulmonary HTN by echo - I suspect this is multifactorial due to diastolic dysfunction (WHO Group II), lung disease/OSA (WHO Group III) and probable high-output physiology from AVF and cirrhosis. Doubt component of WHO Group I disease which would be responsive to selective pulmonary artery vasodilators. - Will check VQ to assess for CTEPH - Refer to Dr.  Chodri in [place] for PFTs and CPAP titration - Plan RHC to further assess  2. CAD - seems to have stable angina which is  well-controlled. Managed per Dr. Arn Medal.  - can consider CR  3. PAF - Followed by Dr. Agustin Cree. Have been discussing Watchman device.   4. CKD IV - AVF in place. Managed by Dr. Justin Mend  5. HTN, severe - Currently well controlled on multiple agents.  6. OSA - Continue CPAP - Settings have not been checked in a long time. Refer to Dr. Alcide Clever for CPAP titration to ensure adequate support  Glori Bickers, MD  12:26 PM

## 2017-12-12 DIAGNOSIS — J449 Chronic obstructive pulmonary disease, unspecified: Secondary | ICD-10-CM | POA: Diagnosis not present

## 2017-12-12 DIAGNOSIS — J42 Unspecified chronic bronchitis: Secondary | ICD-10-CM | POA: Diagnosis not present

## 2017-12-13 ENCOUNTER — Other Ambulatory Visit (HOSPITAL_COMMUNITY): Payer: Self-pay | Admitting: *Deleted

## 2017-12-13 DIAGNOSIS — I272 Pulmonary hypertension, unspecified: Secondary | ICD-10-CM

## 2017-12-17 ENCOUNTER — Telehealth: Payer: Self-pay

## 2017-12-17 NOTE — Telephone Encounter (Signed)
-----   Message from Park Liter, MD sent at 12/17/2017  2:02 PM EDT ----- Monitor showed some supraventricular tachycardia.  I will not change any therapy for now.

## 2017-12-17 NOTE — Telephone Encounter (Signed)
Left message to return call for event monitor results.

## 2017-12-23 ENCOUNTER — Encounter (HOSPITAL_COMMUNITY): Payer: Self-pay | Admitting: *Deleted

## 2017-12-24 ENCOUNTER — Ambulatory Visit (HOSPITAL_COMMUNITY)
Admission: RE | Admit: 2017-12-24 | Discharge: 2017-12-24 | Disposition: A | Discharge status: Home / Self Care | Payer: Medicare HMO | Source: Ambulatory Visit | Attending: Internal Medicine | Admitting: Internal Medicine

## 2017-12-24 ENCOUNTER — Other Ambulatory Visit: Payer: Self-pay

## 2017-12-24 ENCOUNTER — Ambulatory Visit (HOSPITAL_COMMUNITY)
Admission: RE | Disposition: A | Discharge status: Home / Self Care | Payer: Self-pay | Source: Ambulatory Visit | Attending: Internal Medicine

## 2017-12-24 DIAGNOSIS — D519 Vitamin B12 deficiency anemia, unspecified: Secondary | ICD-10-CM | POA: Insufficient documentation

## 2017-12-24 DIAGNOSIS — G4733 Obstructive sleep apnea (adult) (pediatric): Secondary | ICD-10-CM | POA: Diagnosis not present

## 2017-12-24 DIAGNOSIS — K219 Gastro-esophageal reflux disease without esophagitis: Secondary | ICD-10-CM | POA: Insufficient documentation

## 2017-12-24 DIAGNOSIS — E1122 Type 2 diabetes mellitus with diabetic chronic kidney disease: Secondary | ICD-10-CM | POA: Diagnosis not present

## 2017-12-24 DIAGNOSIS — I471 Supraventricular tachycardia: Secondary | ICD-10-CM | POA: Insufficient documentation

## 2017-12-24 DIAGNOSIS — I132 Hypertensive heart and chronic kidney disease with heart failure and with stage 5 chronic kidney disease, or end stage renal disease: Secondary | ICD-10-CM | POA: Insufficient documentation

## 2017-12-24 DIAGNOSIS — Z8249 Family history of ischemic heart disease and other diseases of the circulatory system: Secondary | ICD-10-CM | POA: Insufficient documentation

## 2017-12-24 DIAGNOSIS — Z885 Allergy status to narcotic agent status: Secondary | ICD-10-CM | POA: Insufficient documentation

## 2017-12-24 DIAGNOSIS — Z86718 Personal history of other venous thrombosis and embolism: Secondary | ICD-10-CM | POA: Insufficient documentation

## 2017-12-24 DIAGNOSIS — Z992 Dependence on renal dialysis: Secondary | ICD-10-CM | POA: Insufficient documentation

## 2017-12-24 DIAGNOSIS — I6529 Occlusion and stenosis of unspecified carotid artery: Secondary | ICD-10-CM | POA: Insufficient documentation

## 2017-12-24 DIAGNOSIS — G5603 Carpal tunnel syndrome, bilateral upper limbs: Secondary | ICD-10-CM | POA: Insufficient documentation

## 2017-12-24 DIAGNOSIS — E669 Obesity, unspecified: Secondary | ICD-10-CM | POA: Insufficient documentation

## 2017-12-24 DIAGNOSIS — D631 Anemia in chronic kidney disease: Secondary | ICD-10-CM | POA: Insufficient documentation

## 2017-12-24 DIAGNOSIS — F329 Major depressive disorder, single episode, unspecified: Secondary | ICD-10-CM | POA: Diagnosis not present

## 2017-12-24 DIAGNOSIS — Z888 Allergy status to other drugs, medicaments and biological substances status: Secondary | ICD-10-CM | POA: Insufficient documentation

## 2017-12-24 DIAGNOSIS — R0602 Shortness of breath: Secondary | ICD-10-CM | POA: Diagnosis not present

## 2017-12-24 DIAGNOSIS — I272 Pulmonary hypertension, unspecified: Secondary | ICD-10-CM

## 2017-12-24 DIAGNOSIS — E785 Hyperlipidemia, unspecified: Secondary | ICD-10-CM | POA: Diagnosis not present

## 2017-12-24 DIAGNOSIS — F419 Anxiety disorder, unspecified: Secondary | ICD-10-CM | POA: Insufficient documentation

## 2017-12-24 DIAGNOSIS — I480 Paroxysmal atrial fibrillation: Secondary | ICD-10-CM | POA: Insufficient documentation

## 2017-12-24 DIAGNOSIS — Z981 Arthrodesis status: Secondary | ICD-10-CM | POA: Insufficient documentation

## 2017-12-24 DIAGNOSIS — Z823 Family history of stroke: Secondary | ICD-10-CM | POA: Diagnosis not present

## 2017-12-24 DIAGNOSIS — Z87891 Personal history of nicotine dependence: Secondary | ICD-10-CM | POA: Diagnosis not present

## 2017-12-24 DIAGNOSIS — I252 Old myocardial infarction: Secondary | ICD-10-CM | POA: Insufficient documentation

## 2017-12-24 DIAGNOSIS — K7460 Unspecified cirrhosis of liver: Secondary | ICD-10-CM | POA: Diagnosis not present

## 2017-12-24 DIAGNOSIS — I5042 Chronic combined systolic (congestive) and diastolic (congestive) heart failure: Secondary | ICD-10-CM | POA: Diagnosis not present

## 2017-12-24 DIAGNOSIS — Z7901 Long term (current) use of anticoagulants: Secondary | ICD-10-CM | POA: Insufficient documentation

## 2017-12-24 DIAGNOSIS — I2721 Secondary pulmonary arterial hypertension: Secondary | ICD-10-CM | POA: Diagnosis not present

## 2017-12-24 DIAGNOSIS — I4892 Unspecified atrial flutter: Secondary | ICD-10-CM | POA: Diagnosis not present

## 2017-12-24 DIAGNOSIS — N186 End stage renal disease: Secondary | ICD-10-CM | POA: Insufficient documentation

## 2017-12-24 DIAGNOSIS — Z79899 Other long term (current) drug therapy: Secondary | ICD-10-CM | POA: Insufficient documentation

## 2017-12-24 DIAGNOSIS — Z955 Presence of coronary angioplasty implant and graft: Secondary | ICD-10-CM | POA: Insufficient documentation

## 2017-12-24 DIAGNOSIS — Z9889 Other specified postprocedural states: Secondary | ICD-10-CM | POA: Insufficient documentation

## 2017-12-24 DIAGNOSIS — R531 Weakness: Secondary | ICD-10-CM | POA: Diagnosis not present

## 2017-12-24 HISTORY — PX: RIGHT HEART CATH: CATH118263

## 2017-12-24 LAB — BASIC METABOLIC PANEL
ANION GAP: 9 (ref 5–15)
BUN: 60 mg/dL — AB (ref 6–20)
CALCIUM: 9.1 mg/dL (ref 8.9–10.3)
CO2: 27 mmol/L (ref 22–32)
Chloride: 104 mmol/L (ref 101–111)
Creatinine, Ser: 4.71 mg/dL — ABNORMAL HIGH (ref 0.61–1.24)
GFR calc Af Amer: 15 mL/min — ABNORMAL LOW (ref >60)
GFR, EST NON AFRICAN AMERICAN: 13 mL/min — AB (ref >60)
GLUCOSE: 128 mg/dL — AB (ref 65–99)
POTASSIUM: 3.8 mmol/L (ref 3.5–5.1)
Sodium: 140 mmol/L (ref 135–145)

## 2017-12-24 LAB — POCT I-STAT 3, VENOUS BLOOD GAS (G3P V)
ACID-BASE EXCESS: 1 mmol/L (ref 0.0–2.0)
Acid-Base Excess: 1 mmol/L (ref 0.0–2.0)
Acid-Base Excess: 1 mmol/L (ref 0.0–2.0)
BICARBONATE: 25.7 mmol/L (ref 20.0–28.0)
Bicarbonate: 25.9 mmol/L (ref 20.0–28.0)
Bicarbonate: 26 mmol/L (ref 20.0–28.0)
Bicarbonate: 26.1 mmol/L (ref 20.0–28.0)
O2 SAT: 81 %
O2 Saturation: 77 %
O2 Saturation: 79 %
O2 Saturation: 79 %
PCO2 VEN: 42 mmHg — AB (ref 44.0–60.0)
PH VEN: 7.367 (ref 7.250–7.430)
PH VEN: 7.396 (ref 7.250–7.430)
PH VEN: 7.401 (ref 7.250–7.430)
PO2 VEN: 43 mmHg (ref 32.0–45.0)
PO2 VEN: 43 mmHg (ref 32.0–45.0)
PO2 VEN: 45 mmHg (ref 32.0–45.0)
TCO2: 27 mmol/L (ref 22–32)
TCO2: 27 mmol/L (ref 22–32)
TCO2: 27 mmol/L (ref 22–32)
TCO2: 27 mmol/L (ref 22–32)
pCO2, Ven: 41.9 mmHg — ABNORMAL LOW (ref 44.0–60.0)
pCO2, Ven: 42.2 mmHg — ABNORMAL LOW (ref 44.0–60.0)
pCO2, Ven: 45.4 mmHg (ref 44.0–60.0)
pH, Ven: 7.396 (ref 7.250–7.430)
pO2, Ven: 44 mmHg (ref 32.0–45.0)

## 2017-12-24 LAB — GLUCOSE, CAPILLARY: GLUCOSE-CAPILLARY: 113 mg/dL — AB (ref 65–99)

## 2017-12-24 SURGERY — RIGHT HEART CATH
Anesthesia: LOCAL

## 2017-12-24 MED ORDER — SODIUM CHLORIDE 0.9% FLUSH: 3.0000 mL | INTRAVENOUS | Status: DC | PRN

## 2017-12-24 MED ORDER — OXYCODONE HCL 5 MG PO TABS
5.0000 mg | ORAL_TABLET | Freq: Once | ORAL | Status: AC
  Administered 2017-12-24: 5 mg via ORAL

## 2017-12-24 MED ORDER — ASPIRIN 81 MG PO CHEW
CHEWABLE_TABLET | ORAL | Status: AC
  Administered 2017-12-24: 81 mg via ORAL
  Filled 2017-12-24: qty 1

## 2017-12-24 MED ORDER — FENTANYL CITRATE (PF) 100 MCG/2ML IJ SOLN
INTRAMUSCULAR | Status: DC | PRN
  Administered 2017-12-24: 25 ug via INTRAVENOUS

## 2017-12-24 MED ORDER — SODIUM CHLORIDE 0.9 % IV SOLN
INTRAVENOUS | Status: DC
  Administered 2017-12-24: 11:00:00 via INTRAVENOUS

## 2017-12-24 MED ORDER — HEPARIN (PORCINE) IN NACL 1000-0.9 UT/500ML-% IV SOLN
INTRAVENOUS | Status: AC
  Filled 2017-12-24: qty 1000

## 2017-12-24 MED ORDER — HEPARIN (PORCINE) IN NACL 2-0.9 UNITS/ML
INTRAMUSCULAR | Status: AC | PRN
  Administered 2017-12-24: 1000 mL

## 2017-12-24 MED ORDER — LIDOCAINE HCL (PF) 1 % IJ SOLN
INTRAMUSCULAR | Status: AC
  Filled 2017-12-24: qty 30

## 2017-12-24 MED ORDER — OXYCODONE HCL 5 MG PO TABS
ORAL_TABLET | ORAL | Status: AC
  Administered 2017-12-24: 5 mg via ORAL
  Filled 2017-12-24: qty 1

## 2017-12-24 MED ORDER — MIDAZOLAM HCL 2 MG/2ML IJ SOLN
INTRAMUSCULAR | Status: AC
  Filled 2017-12-24: qty 2

## 2017-12-24 MED ORDER — SODIUM CHLORIDE 0.9% FLUSH: 3.0000 mL | Freq: Two times a day (BID) | INTRAVENOUS | Status: DC

## 2017-12-24 MED ORDER — MIDAZOLAM HCL 2 MG/2ML IJ SOLN
INTRAMUSCULAR | Status: DC | PRN
  Administered 2017-12-24: 1 mg via INTRAVENOUS
  Administered 2017-12-24: 2 mg via INTRAVENOUS

## 2017-12-24 MED ORDER — SODIUM CHLORIDE 0.9 % IV SOLN: 250.0000 mL | INTRAVENOUS | Status: DC | PRN

## 2017-12-24 MED ORDER — ONDANSETRON HCL 4 MG/2ML IJ SOLN: 4.0000 mg | Freq: Four times a day (QID) | INTRAMUSCULAR | Status: DC | PRN

## 2017-12-24 MED ORDER — FENTANYL CITRATE (PF) 100 MCG/2ML IJ SOLN
INTRAMUSCULAR | Status: AC
  Filled 2017-12-24: qty 2

## 2017-12-24 MED ORDER — LIDOCAINE HCL (PF) 1 % IJ SOLN
INTRAMUSCULAR | Status: DC | PRN
  Administered 2017-12-24: 17 mL

## 2017-12-24 MED ORDER — TECHNETIUM TC 99M DIETHYLENETRIAME-PENTAACETIC ACID
30.0000 | Freq: Once | INTRAVENOUS | Status: AC | PRN
  Administered 2017-12-24: 30 via RESPIRATORY_TRACT

## 2017-12-24 MED ORDER — ASPIRIN 81 MG PO CHEW
81.0000 mg | CHEWABLE_TABLET | ORAL | Status: AC
  Administered 2017-12-24: 81 mg via ORAL

## 2017-12-24 MED ORDER — ACETAMINOPHEN 325 MG PO TABS: 650.0000 mg | ORAL_TABLET | ORAL | Status: DC | PRN

## 2017-12-24 SURGICAL SUPPLY — 9 items
CATH SWAN GANZ 7F STRAIGHT (CATHETERS) ×2 IMPLANT
PACK CARDIAC CATHETERIZATION (CUSTOM PROCEDURE TRAY) ×2 IMPLANT
PROTECTION STATION PRESSURIZED (MISCELLANEOUS) ×2
SHEATH AVANTI 11CM 7FR (SHEATH) ×2 IMPLANT
STATION PROTECTION PRESSURIZED (MISCELLANEOUS) ×1 IMPLANT
TRANSDUCER W/STOPCOCK (MISCELLANEOUS) ×2 IMPLANT
TUBING ART PRESS 72  MALE/FEM (TUBING) ×1
TUBING ART PRESS 72 MALE/FEM (TUBING) ×1 IMPLANT
WIRE EMERALD 3MM-J .025X260CM (WIRE) ×2 IMPLANT

## 2017-12-24 NOTE — Interval H&P Note (Signed)
History and Physical Interval Note:  12/24/2017 11:16 AM  Carlos Heath  has presented today for surgery, with the diagnosis of Pulmonary HTN  The various methods of treatment have been discussed with the patient and family. After consideration of risks, benefits and other options for treatment, the patient has consented to  Procedure(s): RIGHT HEART CATH (N/A) as a surgical intervention .  The patient's history has been reviewed, patient examined, no change in status, stable for surgery.  I have reviewed the patient's chart and labs.  Questions were answered to the patient's satisfaction.     Daniel Bensimhon

## 2017-12-24 NOTE — Progress Notes (Signed)
Up and walked and tolerated well; right groin stable no bleeding or hematoma 

## 2017-12-24 NOTE — Research (Signed)
CADFEM Informed Consent   Subject Name: Carlos Heath  Subject met inclusion and exclusion criteria.  The informed consent form, study requirements and expectations were reviewed with the subject and questions and concerns were addressed prior to the signing of the consent form.  The subject verbalized understanding of the trail requirements.  The subject agreed to participate in the CADFEM trial and signed the informed consent.  The informed consent was obtained prior to performance of any protocol-specific procedures for the subject.  A copy of the signed informed consent was given to the subject and a copy was placed in the subject's medical record.  Neva Seat 12/24/2017, 2:47 PM

## 2017-12-24 NOTE — Discharge Instructions (Signed)

## 2017-12-27 ENCOUNTER — Encounter (HOSPITAL_COMMUNITY): Payer: Self-pay | Admitting: Internal Medicine

## 2017-12-27 MED FILL — Heparin Sod (Porcine)-NaCl IV Soln 1000 Unit/500ML-0.9%: INTRAVENOUS | Qty: 1000 | Status: AC

## 2017-12-31 ENCOUNTER — Other Ambulatory Visit: Payer: Self-pay

## 2017-12-31 MED ORDER — CLOPIDOGREL BISULFATE 75 MG PO TABS: 75.0000 mg | ORAL_TABLET | Freq: Every day | ORAL | 3 refills | Status: AC

## 2018-01-07 DIAGNOSIS — J449 Chronic obstructive pulmonary disease, unspecified: Secondary | ICD-10-CM | POA: Diagnosis not present

## 2018-01-07 DIAGNOSIS — I739 Peripheral vascular disease, unspecified: Secondary | ICD-10-CM | POA: Diagnosis not present

## 2018-01-07 DIAGNOSIS — I509 Heart failure, unspecified: Secondary | ICD-10-CM | POA: Diagnosis not present

## 2018-01-07 DIAGNOSIS — E785 Hyperlipidemia, unspecified: Secondary | ICD-10-CM | POA: Diagnosis not present

## 2018-01-07 DIAGNOSIS — Z939 Artificial opening status, unspecified: Secondary | ICD-10-CM | POA: Diagnosis not present

## 2018-01-07 DIAGNOSIS — I25119 Atherosclerotic heart disease of native coronary artery with unspecified angina pectoris: Secondary | ICD-10-CM | POA: Diagnosis not present

## 2018-01-07 DIAGNOSIS — I4891 Unspecified atrial fibrillation: Secondary | ICD-10-CM | POA: Diagnosis not present

## 2018-01-07 DIAGNOSIS — I130 Hypertensive heart and chronic kidney disease with heart failure and stage 1 through stage 4 chronic kidney disease, or unspecified chronic kidney disease: Secondary | ICD-10-CM | POA: Diagnosis not present

## 2018-01-07 DIAGNOSIS — I770 Arteriovenous fistula, acquired: Secondary | ICD-10-CM | POA: Diagnosis not present

## 2018-01-11 DIAGNOSIS — R5383 Other fatigue: Secondary | ICD-10-CM | POA: Diagnosis not present

## 2018-01-11 DIAGNOSIS — J42 Unspecified chronic bronchitis: Secondary | ICD-10-CM | POA: Diagnosis not present

## 2018-01-11 DIAGNOSIS — J449 Chronic obstructive pulmonary disease, unspecified: Secondary | ICD-10-CM | POA: Diagnosis not present

## 2018-01-11 DIAGNOSIS — J4540 Moderate persistent asthma, uncomplicated: Secondary | ICD-10-CM | POA: Diagnosis not present

## 2018-01-11 DIAGNOSIS — G4733 Obstructive sleep apnea (adult) (pediatric): Secondary | ICD-10-CM | POA: Diagnosis not present

## 2018-01-21 DIAGNOSIS — N189 Chronic kidney disease, unspecified: Secondary | ICD-10-CM | POA: Diagnosis not present

## 2018-01-21 DIAGNOSIS — Z125 Encounter for screening for malignant neoplasm of prostate: Secondary | ICD-10-CM | POA: Diagnosis not present

## 2018-01-21 DIAGNOSIS — R69 Illness, unspecified: Secondary | ICD-10-CM | POA: Diagnosis not present

## 2018-01-21 DIAGNOSIS — Z01818 Encounter for other preprocedural examination: Secondary | ICD-10-CM | POA: Diagnosis not present

## 2018-01-21 DIAGNOSIS — I340 Nonrheumatic mitral (valve) insufficiency: Secondary | ICD-10-CM | POA: Diagnosis not present

## 2018-01-21 DIAGNOSIS — Z01810 Encounter for preprocedural cardiovascular examination: Secondary | ICD-10-CM | POA: Diagnosis not present

## 2018-01-21 DIAGNOSIS — N186 End stage renal disease: Secondary | ICD-10-CM | POA: Diagnosis not present

## 2018-01-21 DIAGNOSIS — R972 Elevated prostate specific antigen [PSA]: Secondary | ICD-10-CM | POA: Diagnosis not present

## 2018-01-21 DIAGNOSIS — E1122 Type 2 diabetes mellitus with diabetic chronic kidney disease: Secondary | ICD-10-CM | POA: Diagnosis not present

## 2018-01-21 DIAGNOSIS — R931 Abnormal findings on diagnostic imaging of heart and coronary circulation: Secondary | ICD-10-CM | POA: Diagnosis not present

## 2018-01-21 DIAGNOSIS — I6522 Occlusion and stenosis of left carotid artery: Secondary | ICD-10-CM | POA: Diagnosis not present

## 2018-01-21 DIAGNOSIS — Z7289 Other problems related to lifestyle: Secondary | ICD-10-CM | POA: Diagnosis not present

## 2018-01-26 DIAGNOSIS — I120 Hypertensive chronic kidney disease with stage 5 chronic kidney disease or end stage renal disease: Secondary | ICD-10-CM | POA: Diagnosis not present

## 2018-01-26 DIAGNOSIS — I5030 Unspecified diastolic (congestive) heart failure: Secondary | ICD-10-CM | POA: Diagnosis not present

## 2018-01-26 DIAGNOSIS — I272 Pulmonary hypertension, unspecified: Secondary | ICD-10-CM | POA: Diagnosis not present

## 2018-01-26 DIAGNOSIS — I770 Arteriovenous fistula, acquired: Secondary | ICD-10-CM | POA: Diagnosis not present

## 2018-01-26 DIAGNOSIS — N2581 Secondary hyperparathyroidism of renal origin: Secondary | ICD-10-CM | POA: Diagnosis not present

## 2018-01-26 DIAGNOSIS — Z8719 Personal history of other diseases of the digestive system: Secondary | ICD-10-CM | POA: Diagnosis not present

## 2018-01-26 DIAGNOSIS — I4892 Unspecified atrial flutter: Secondary | ICD-10-CM | POA: Diagnosis not present

## 2018-01-26 DIAGNOSIS — E1122 Type 2 diabetes mellitus with diabetic chronic kidney disease: Secondary | ICD-10-CM | POA: Diagnosis not present

## 2018-01-26 DIAGNOSIS — N185 Chronic kidney disease, stage 5: Secondary | ICD-10-CM | POA: Diagnosis not present

## 2018-01-26 DIAGNOSIS — D631 Anemia in chronic kidney disease: Secondary | ICD-10-CM | POA: Diagnosis not present

## 2018-02-06 DIAGNOSIS — G4733 Obstructive sleep apnea (adult) (pediatric): Secondary | ICD-10-CM | POA: Diagnosis not present

## 2018-02-10 DIAGNOSIS — I2510 Atherosclerotic heart disease of native coronary artery without angina pectoris: Secondary | ICD-10-CM | POA: Diagnosis not present

## 2018-02-10 DIAGNOSIS — Z01818 Encounter for other preprocedural examination: Secondary | ICD-10-CM | POA: Diagnosis not present

## 2018-02-10 DIAGNOSIS — E785 Hyperlipidemia, unspecified: Secondary | ICD-10-CM | POA: Diagnosis not present

## 2018-02-10 DIAGNOSIS — I480 Paroxysmal atrial fibrillation: Secondary | ICD-10-CM | POA: Diagnosis not present

## 2018-02-10 DIAGNOSIS — I509 Heart failure, unspecified: Secondary | ICD-10-CM | POA: Diagnosis not present

## 2018-02-10 DIAGNOSIS — K219 Gastro-esophageal reflux disease without esophagitis: Secondary | ICD-10-CM | POA: Diagnosis not present

## 2018-02-10 DIAGNOSIS — I132 Hypertensive heart and chronic kidney disease with heart failure and with stage 5 chronic kidney disease, or end stage renal disease: Secondary | ICD-10-CM | POA: Diagnosis not present

## 2018-02-10 DIAGNOSIS — N186 End stage renal disease: Secondary | ICD-10-CM | POA: Diagnosis not present

## 2018-02-10 DIAGNOSIS — I420 Dilated cardiomyopathy: Secondary | ICD-10-CM | POA: Diagnosis not present

## 2018-02-10 DIAGNOSIS — Z79899 Other long term (current) drug therapy: Secondary | ICD-10-CM | POA: Diagnosis not present

## 2018-02-11 DIAGNOSIS — J449 Chronic obstructive pulmonary disease, unspecified: Secondary | ICD-10-CM | POA: Diagnosis not present

## 2018-02-11 DIAGNOSIS — J42 Unspecified chronic bronchitis: Secondary | ICD-10-CM | POA: Diagnosis not present

## 2018-02-15 ENCOUNTER — Ambulatory Visit (INDEPENDENT_AMBULATORY_CARE_PROVIDER_SITE_OTHER): Payer: Medicare HMO | Admitting: Cardiology

## 2018-02-15 ENCOUNTER — Encounter: Payer: Self-pay | Admitting: Cardiology

## 2018-02-15 VITALS — BP 90/52 | HR 55 | Ht 68.0 in | Wt 237.0 lb

## 2018-02-15 DIAGNOSIS — J4540 Moderate persistent asthma, uncomplicated: Secondary | ICD-10-CM | POA: Diagnosis not present

## 2018-02-15 DIAGNOSIS — R5383 Other fatigue: Secondary | ICD-10-CM | POA: Diagnosis not present

## 2018-02-15 DIAGNOSIS — I272 Pulmonary hypertension, unspecified: Secondary | ICD-10-CM | POA: Diagnosis not present

## 2018-02-15 DIAGNOSIS — N185 Chronic kidney disease, stage 5: Secondary | ICD-10-CM

## 2018-02-15 DIAGNOSIS — I480 Paroxysmal atrial fibrillation: Secondary | ICD-10-CM | POA: Diagnosis not present

## 2018-02-15 DIAGNOSIS — I2510 Atherosclerotic heart disease of native coronary artery without angina pectoris: Secondary | ICD-10-CM

## 2018-02-15 DIAGNOSIS — G4733 Obstructive sleep apnea (adult) (pediatric): Secondary | ICD-10-CM | POA: Diagnosis not present

## 2018-02-15 DIAGNOSIS — Z9989 Dependence on other enabling machines and devices: Secondary | ICD-10-CM | POA: Diagnosis not present

## 2018-02-15 NOTE — Progress Notes (Signed)
Cardiology Office Note:    Date:  02/15/2018   ID:  [name], DOB 1965/08/03, MRN [id number]  PCP:  Maryella Shivers, MD  Cardiologist:  Jenne Campus, MD    Referring MD: Maryella Shivers, MD   Chief Complaint  Patient presents with  . Follow-up  . Pre-op Exam  I need kidney transplant and I need cardiac catheterization  History of Present Illness:    Carlos Heath is a 52 y.o. male with very complex past medical history.  Briefly he does have coronary artery disease with multiple interventions, lifelong hypertension, progressive end-stage kidney disease and getting ready for either dialysis or kidney transplant.  Also recently recognized pulmonary hypertension which felt to be related to significant AV shunt because of AV fistula.  Recently he was evaluated by [facility] team for transplantation he started having discussion about potentially doing cardiac catheterization as a part of evaluation and he is here to talk about that.  I told him that cardiac catheterization normal is part of evaluation before kidney transplant however he is not on dialysis yet and I told him that doing cardiac catheterization most likely will lead to complete kidney failure that will require cardiac catheterization he is very much aware of this and is ready to proceed.  We actually spent a lot of time talking about this entire scenario.  Visit was at least 45 minutes kind of talking to some nurse coordinator for kidney transplant team at [facility].  And we decided that his cardiac catheterization need to be done.  We talked about place that his cardiac catheterization can be done in my opinion the best option for him will have all the studies done at [facility].  Theoretically I can do it in the [facility] however before this cardiac catheterization he will require to see nephrology as well as potentially after cardiac catheterization required dialysis therefore I think the better option will be to  proceed with all this testing in [facility]. Overall he complained of being weak tired exhausted does have some skipped beats sometimes but overall seems to be doing quite okay  Past Medical History:  Diagnosis Date  . Acute diastolic CHF (congestive heart failure) ([place]) 07/01/2012  . Anemia in stage 4 chronic kidney disease ([place]) 10/16/2016  . Anginal pain ([place])   . Anxiety   . Atrial flutter ([place]) 04/16/2016  . B12 deficiency anemia    "get shot q month" (07/01/2012)  . CAD (coronary artery disease)   . Carotid artery occlusion   . Carpal tunnel syndrome on both sides    "wore braces for awhile" (07/01/2012)  . Chest pain 08/05/2015  . CHF (congestive heart failure) ([place])   . Chronic combined systolic and diastolic congestive heart failure ([place]) 01/08/2016  . Chronic kidney disease (CKD), stage IV (severe) ([place]) 09/11/2011  . Chronic renal failure, stage 4 (severe) ([place]) 01/31/2015  . Complication of anesthesia    difficulty urinating  . Coronary artery disease involving native coronary artery of native heart without angina pectoris 01/31/2015  . Depression   . Diabetes mellitus ([place]) 07/01/2012  . DVT (deep venous thrombosis) ([place]) 2011   "RLE; after I went home from a cath; loaded me up w/heparin, lovenox" (07/01/2012)  . Dyslipidemia 01/31/2015  . Edema extremities 07/01/2012  . End stage renal disease ([place]) 09/11/2011  . ESRD (end stage renal disease) ([place])    "have 12% function" (07/01/2012)  . Essential hypertension 07/01/2012  . Exertional dyspnea   .  GERD (gastroesophageal reflux disease)   . GI bleed 05/11/2017  . Gouty arthritis   . Heart murmur   . History of blood transfusion ?2012   "laceration right wrist; lost lots of blood" (07/01/2012)  . Hyperlipidemia   . Hypertension   . Iron deficiency anemia 2012   "had to get shots q week for awhile" (07/01/2012)  . Migraines    "monthly" (07/01/2012)  . Myocardial infarction [facility]) ~ 2005   "couple they suspect"  (07/01/2012)  . Obesity 07/01/2012  . OSA on CPAP 2011  . Other cirrhosis of liver ([place]) 11/17/2016  . Other complications due to renal dialysis device, implant, and graft 11/13/2013  . Paroxysmal atrial fibrillation ([place]) 10/17/2015  . Pre-transplant evaluation for kidney transplant 11/17/2016  . SOB (shortness of breath) 07/01/2012  . Stage 5 chronic kidney disease not on chronic dialysis ([place]) 11/17/2016  . Supraventricular tachycardia ([place]) 11/26/2015  . Type II diabetes mellitus ([place]) 05/2012    Past Surgical History:  Procedure Laterality Date  . ANTERIOR CERVICAL DECOMP/DISCECTOMY FUSION  ~ 2000   "w/plate" (07/01/2012)  . AV FISTULA PLACEMENT  ?2007   right antecub (07/01/2012)  . CERVICAL SPINE SURGERY    . CORONARY ANGIOPLASTY WITH STENT PLACEMENT  2011 X 3   "1 + 2 + 2; 5 total" (07/01/2012)  . FINGER FRACTURE SURGERY Left   . LACERATION REPAIR  2012   "right wrist; fell thru a glass" (07/01/2012)  . RIGHT HEART CATH N/A 12/24/2017   Procedure: RIGHT HEART CATH;  Surgeon: Jolaine Artist, MD;  Location: [facility];  Service: Cardiovascular;  Laterality: N/A;  . TONSILLECTOMY  1970's  . VEIN SURGERY  03/05/2010   "moved vein up into arm for dialysis access; was too deep"; right upper arm" (07/01/2012)    Current Medications: Current Meds  Medication Sig  . albuterol (ACCUNEB) 0.63 MG/3ML nebulizer solution Take 1 ampule by nebulization every 6 (six) hours as needed for wheezing or shortness of breath.   Marland Kitchen atorvastatin (LIPITOR) 40 MG tablet Take 40 mg by mouth daily.  Marland Kitchen BREO ELLIPTA 200-25 MCG/INH AEPB INHALE 1 PUFF ONCE A DAY  . calcitRIOL (ROCALTROL) 0.25 MCG capsule Take 0.25 mcg by mouth daily.  . cloNIDine (CATAPRES) 0.2 MG tablet Take 0.2 mg by mouth 2 (two) times daily.  . clopidogrel (PLAVIX) 75 MG tablet Take 1 tablet (75 mg total) by mouth daily.  . cyanocobalamin (,VITAMIN B-12,) 1000 MCG/ML injection Inject 1,000 mcg into the muscle every 30 (thirty)  days.   Marland Kitchen desvenlafaxine (PRISTIQ) 100 MG 24 hr tablet Take 100 mg by mouth daily.  . febuxostat (ULORIC) 40 MG tablet Take 40 mg by mouth daily.  . Ferrous Sulfate (IRON) 325 (65 Fe) MG TABS Take 325 mg by mouth 2 (two) times daily.   . furosemide (LASIX) 80 MG tablet Take 160 mg by mouth 2 (two) times daily.  . hydrALAZINE (APRESOLINE) 100 MG tablet Take 100 mg by mouth 2 (two) times daily.  Marland Kitchen labetalol (NORMODYNE) 300 MG tablet Take 150 mg by mouth 2 (two) times daily.  . Melatonin 10 MG TABS Take 10 mg by mouth at bedtime as needed (for sleep).   . metolazone (ZAROXOLYN) 2.5 MG tablet Take 2.5 mg by mouth every other day.   . minoxidil (LONITEN) 10 MG tablet Take 10 mg by mouth 2 (two) times daily.  . montelukast (SINGULAIR) 10 MG tablet Take 10 mg by mouth daily.  . nitroGLYCERIN (NITROSTAT) 0.4 MG  SL tablet Place 0.4 mg under the tongue every 5 (five) minutes as needed for chest pain.   Marland Kitchen omeprazole (PRILOSEC) 40 MG capsule Take 40 mg by mouth 2 (two) times daily.   Marland Kitchen oxyCODONE (OXY IR/ROXICODONE) 5 MG immediate release tablet Take 5 mg by mouth every 4 (four) hours as needed for severe pain.   . potassium chloride SA (K-DUR,KLOR-CON) 20 MEQ tablet Take 100 mEq by mouth 2 (two) times daily.   . ranolazine (RANEXA) 1000 MG SR tablet Take 500 mg by mouth 2 (two) times daily.  Marland Kitchen zolpidem (AMBIEN) 10 MG tablet Take 10 mg by mouth at bedtime as needed for sleep.      Allergies:   Hydrocodone; Norvasc [amlodipine besylate]; Lisinopril; Amlodipine; Morphine; Prednisone; and Morphine and related   Social History   Socioeconomic History  . Marital status: Married    Spouse name: Not on file  . Number of children: Not on file  . Years of education: Not on file  . Highest education level: Not on file  Occupational History  . Not on file  Social Needs  . Financial resource strain: Not on file  . Food insecurity:    Worry: Not on file    Inability: Not on file  . Transportation needs:     Medical: Not on file    Non-medical: Not on file  Tobacco Use  . Smoking status: Former Smoker    Packs/day: 1.00    Years: 35.00    Pack years: 35.00    Types: Cigarettes    Last attempt to quit: 01/11/2011    Years since quitting: 7.1  . Smokeless tobacco: Never Used  Substance and Sexual Activity  . Alcohol use: No    Comment: 07/01/2012 "never drank much; occasionally only; last drink ~ 2 yr ago"  . Drug use: No  . Sexual activity: Never  Lifestyle  . Physical activity:    Days per week: Not on file    Minutes per session: Not on file  . Stress: Not on file  Relationships  . Social connections:    Talks on phone: Not on file    Gets together: Not on file    Attends religious service: Not on file    Active member of club or organization: Not on file    Attends meetings of clubs or organizations: Not on file    Relationship status: Not on file  Other Topics Concern  . Not on file  Social History Narrative  . Not on file     Family History: The patient's family history includes Heart attack in his father and mother; Heart disease in his father, mother, and sister; Hyperlipidemia in his father and mother; Hypertension in his brother, father, and mother; Other in his mother; Stroke in his mother. ROS:   Please see the history of present illness.    All 14 point review of systems negative except as described per history of present illness  EKGs/Labs/Other Studies Reviewed:      Recent Labs: 12/10/2017: Hemoglobin 13.4; Platelets 168 12/24/2017: BUN 60; Creatinine, Ser 4.71; Potassium 3.8; Sodium 140  Recent Lipid Panel No results found for: CHOL, TRIG, HDL, CHOLHDL, VLDL, LDLCALC, LDLDIRECT  Physical Exam:    VS:  BP (!) 90/52 (BP Location: Left Arm, Patient Position: Sitting, Cuff Size: Normal)   Pulse (!) 55   Ht 5\' 8"  (1.727 m)   Wt 237 lb (107.5 kg)   SpO2 96%   BMI 36.04 kg/m  Wt Readings from Last 3 Encounters:  02/15/18 237 lb (107.5 kg)  12/24/17  230 lb (104.3 kg)  12/10/17 229 lb 4 oz (104 kg)     GEN:  Well nourished, well developed in no acute distress HEENT: Normal NECK: No JVD; No carotid bruits LYMPHATICS: No lymphadenopathy CARDIAC: RRR, systolic murmur grade 1/6 to 2/6 best at left border of the sternum, no rubs, no gallops RESPIRATORY:  Clear to auscultation without rales, wheezing or rhonchi  ABDOMEN: Soft, non-tender, non-distended MUSCULOSKELETAL:  No edema; No deformity  SKIN: Warm and dry LOWER EXTREMITIES: no swelling NEUROLOGIC:  Alert and oriented x 3 PSYCHIATRIC:  Normal affect   ASSESSMENT:    1. Coronary artery disease involving native coronary artery of native heart without angina pectoris   2. Paroxysmal atrial fibrillation (HCC)   3. Pulmonary hypertension, unspecified ([place])   4. OSA on CPAP   5. Stage 5 chronic kidney disease not on chronic dialysis ([place])    PLAN:    In order of problems listed above:  1. Coronary artery disease, stable asymptomatic right now but he did have a stress test which was very worrisome which include worsening of ejection fraction with stress.  That is why we contemplated doing cardiac catheterization. 2. Paroxysmal atrial fibrillation.  Continue with present management which include anticoagulation. 3. Pulmonary hypertension felt to be related to significant AV shunt and apparently she does have a discussion with vascular surgery about some surgical intervention on his shunt however it looks like first what we need to do his cardiac catheterization 4. Obstructive sleep apnea on CPAP which I will continue. 5. Chronic kidney disease again getting ready to have his cardiac catheterization to be qualified for potential dialysis and kidney transplant.   Medication Adjustments/Labs and Tests Ordered: Current medicines are reviewed at length with the patient today.  Concerns regarding medicines are outlined above.  No orders of the defined types were placed in this  encounter.  Medication changes: No orders of the defined types were placed in this encounter.   Signed, Park Liter, MD, [facility] 02/15/2018 1:13 PM    [place]

## 2018-02-15 NOTE — Patient Instructions (Signed)
Medication Instructions:  Your physician recommends that you continue on your current medications as directed. Please refer to the Current Medication list given to you today.   Labwork: None.  Testing/Procedures: None.  Follow-Up: Your physician wants you to follow-up in: 3 month. You will receive a reminder letter in the mail two months in advance. If you don't receive a letter, please call our office to schedule the follow-up appointment.   Any Other Special Instructions Will Be Listed Below (If Applicable).     If you need a refill on your cardiac medications before your next appointment, please call your pharmacy.

## 2018-03-09 ENCOUNTER — Telehealth: Payer: Self-pay | Admitting: *Deleted

## 2018-03-09 NOTE — Telephone Encounter (Signed)
Patient states his symptoms have gotten worse since last office visit on 02/15/18. He reports sleeping a lot more, no energy, and his heart is skipping beats more frequently. Patient reports that his "heart rate is all over the place." This morning he has ranged from 49 beats per minute to 80. He has not been keeping tracking of his blood pressure. Patient confirmed that he has an appointment at [facility] with a cardiologist to discuss plan of care on 03/21/18 but patient is unaware of the office location or doctor's name. Will discuss with Dr. Agustin Cree and contact patient with updates.

## 2018-03-09 NOTE — Telephone Encounter (Signed)
Pt is not feeling well. Over last 2 weeks is feeling worse, Heart skipping, racing and slowing down, no energy. Set up to see [facility]. At [facility] on 9/9 and then have cath done. Does this need to be moved up due to symptoms? Please advise.

## 2018-03-10 ENCOUNTER — Ambulatory Visit (INDEPENDENT_AMBULATORY_CARE_PROVIDER_SITE_OTHER): Payer: Medicare HMO | Admitting: Cardiology

## 2018-03-10 VITALS — BP 130/68 | HR 50 | Resp 14

## 2018-03-10 DIAGNOSIS — I480 Paroxysmal atrial fibrillation: Secondary | ICD-10-CM | POA: Diagnosis not present

## 2018-03-10 NOTE — Patient Instructions (Signed)
Medication Instructions:  Your physician recommends that you continue on your current medications as directed. Please refer to the Current Medication list given to you today.   Labwork: None.  Testing/Procedures: None.  Follow-Up: Follow up as previously directed.   Any Other Special Instructions Will Be Listed Below (If Applicable)   If you need a refill on your cardiac medications before your next appointment, please call your pharmacy.

## 2018-03-10 NOTE — Telephone Encounter (Signed)
Spoke with patient's wife, Dalene Seltzer, per DPR to let her know Dr. Agustin Cree has advised that patient go to the [place] office for an EKG today. Patient has been scheduled for a nurse visit today at 9:30 am. Dalene Seltzer verbalized understanding. No further questions.

## 2018-03-10 NOTE — Progress Notes (Signed)
Patient in to have a ekg and vital signs per Dr. Agustin Cree. Patient complains of not feeling well for two weeks. Reports having palpitations at night and feeling like his brain is swelling and pounding with the palpitations. His hear rate last night was between 40 and 50. Dr. Agustin Cree reviewed the patient's ekg and reccommended patient followed up with as planned with [facility]. Patient informed and verbally understands.

## 2018-03-14 DIAGNOSIS — J449 Chronic obstructive pulmonary disease, unspecified: Secondary | ICD-10-CM | POA: Diagnosis not present

## 2018-03-14 DIAGNOSIS — J42 Unspecified chronic bronchitis: Secondary | ICD-10-CM | POA: Diagnosis not present

## 2018-03-21 DIAGNOSIS — I132 Hypertensive heart and chronic kidney disease with heart failure and with stage 5 chronic kidney disease, or end stage renal disease: Secondary | ICD-10-CM | POA: Diagnosis not present

## 2018-03-21 DIAGNOSIS — Z79899 Other long term (current) drug therapy: Secondary | ICD-10-CM | POA: Diagnosis not present

## 2018-03-21 DIAGNOSIS — N185 Chronic kidney disease, stage 5: Secondary | ICD-10-CM | POA: Diagnosis not present

## 2018-03-21 DIAGNOSIS — D631 Anemia in chronic kidney disease: Secondary | ICD-10-CM | POA: Diagnosis not present

## 2018-03-21 DIAGNOSIS — I481 Persistent atrial fibrillation: Secondary | ICD-10-CM | POA: Diagnosis not present

## 2018-03-21 DIAGNOSIS — I480 Paroxysmal atrial fibrillation: Secondary | ICD-10-CM | POA: Diagnosis not present

## 2018-03-21 DIAGNOSIS — R9439 Abnormal result of other cardiovascular function study: Secondary | ICD-10-CM | POA: Diagnosis not present

## 2018-03-21 DIAGNOSIS — I25118 Atherosclerotic heart disease of native coronary artery with other forms of angina pectoris: Secondary | ICD-10-CM | POA: Diagnosis not present

## 2018-03-21 DIAGNOSIS — E785 Hyperlipidemia, unspecified: Secondary | ICD-10-CM | POA: Diagnosis not present

## 2018-03-21 DIAGNOSIS — I5042 Chronic combined systolic (congestive) and diastolic (congestive) heart failure: Secondary | ICD-10-CM | POA: Diagnosis not present

## 2018-03-21 DIAGNOSIS — I272 Pulmonary hypertension, unspecified: Secondary | ICD-10-CM | POA: Diagnosis not present

## 2018-03-29 DIAGNOSIS — G4733 Obstructive sleep apnea (adult) (pediatric): Secondary | ICD-10-CM | POA: Diagnosis not present

## 2018-03-31 DIAGNOSIS — K740 Hepatic fibrosis: Secondary | ICD-10-CM | POA: Diagnosis not present

## 2018-03-31 DIAGNOSIS — I120 Hypertensive chronic kidney disease with stage 5 chronic kidney disease or end stage renal disease: Secondary | ICD-10-CM | POA: Diagnosis not present

## 2018-03-31 DIAGNOSIS — Z87891 Personal history of nicotine dependence: Secondary | ICD-10-CM | POA: Diagnosis not present

## 2018-03-31 DIAGNOSIS — I5042 Chronic combined systolic (congestive) and diastolic (congestive) heart failure: Secondary | ICD-10-CM | POA: Diagnosis not present

## 2018-03-31 DIAGNOSIS — N186 End stage renal disease: Secondary | ICD-10-CM | POA: Diagnosis not present

## 2018-03-31 DIAGNOSIS — I132 Hypertensive heart and chronic kidney disease with heart failure and with stage 5 chronic kidney disease, or end stage renal disease: Secondary | ICD-10-CM | POA: Diagnosis not present

## 2018-03-31 DIAGNOSIS — I509 Heart failure, unspecified: Secondary | ICD-10-CM | POA: Diagnosis not present

## 2018-03-31 DIAGNOSIS — Z992 Dependence on renal dialysis: Secondary | ICD-10-CM | POA: Diagnosis not present

## 2018-03-31 DIAGNOSIS — Z955 Presence of coronary angioplasty implant and graft: Secondary | ICD-10-CM | POA: Diagnosis not present

## 2018-03-31 DIAGNOSIS — I4891 Unspecified atrial fibrillation: Secondary | ICD-10-CM | POA: Diagnosis not present

## 2018-03-31 DIAGNOSIS — J449 Chronic obstructive pulmonary disease, unspecified: Secondary | ICD-10-CM | POA: Diagnosis not present

## 2018-03-31 DIAGNOSIS — T82898A Other specified complication of vascular prosthetic devices, implants and grafts, initial encounter: Secondary | ICD-10-CM | POA: Diagnosis not present

## 2018-03-31 DIAGNOSIS — N185 Chronic kidney disease, stage 5: Secondary | ICD-10-CM | POA: Diagnosis not present

## 2018-03-31 DIAGNOSIS — I2510 Atherosclerotic heart disease of native coronary artery without angina pectoris: Secondary | ICD-10-CM | POA: Diagnosis not present

## 2018-04-01 ENCOUNTER — Telehealth: Payer: Self-pay | Admitting: Cardiology

## 2018-04-01 DIAGNOSIS — I5042 Chronic combined systolic (congestive) and diastolic (congestive) heart failure: Secondary | ICD-10-CM | POA: Diagnosis not present

## 2018-04-01 NOTE — Telephone Encounter (Signed)
Advised patient that with his significant history he should proceed with the CABG at [facility] as directed by cardiologist there. Patient was agreeable. Patient requested that Dr. Agustin Cree review his cath report from [facility].

## 2018-04-01 NOTE — Telephone Encounter (Signed)
Patient had a heart cath and now needs to go to [facility] because they found he needs a triple bypass. Patient is asking for advice on where to have this done.

## 2018-04-04 DIAGNOSIS — G4733 Obstructive sleep apnea (adult) (pediatric): Secondary | ICD-10-CM | POA: Diagnosis not present

## 2018-04-05 DIAGNOSIS — I25118 Atherosclerotic heart disease of native coronary artery with other forms of angina pectoris: Secondary | ICD-10-CM | POA: Diagnosis not present

## 2018-04-05 DIAGNOSIS — Z01818 Encounter for other preprocedural examination: Secondary | ICD-10-CM | POA: Diagnosis not present

## 2018-04-05 DIAGNOSIS — I868 Varicose veins of other specified sites: Secondary | ICD-10-CM | POA: Diagnosis not present

## 2018-04-05 DIAGNOSIS — I2510 Atherosclerotic heart disease of native coronary artery without angina pectoris: Secondary | ICD-10-CM | POA: Diagnosis not present

## 2018-04-05 DIAGNOSIS — Z01810 Encounter for preprocedural cardiovascular examination: Secondary | ICD-10-CM | POA: Diagnosis not present

## 2018-04-19 DIAGNOSIS — R0603 Acute respiratory distress: Secondary | ICD-10-CM | POA: Diagnosis not present

## 2018-04-19 DIAGNOSIS — I471 Supraventricular tachycardia: Secondary | ICD-10-CM | POA: Diagnosis not present

## 2018-04-19 DIAGNOSIS — I120 Hypertensive chronic kidney disease with stage 5 chronic kidney disease or end stage renal disease: Secondary | ICD-10-CM | POA: Diagnosis not present

## 2018-04-19 DIAGNOSIS — I97120 Postprocedural cardiac arrest following cardiac surgery: Secondary | ICD-10-CM | POA: Diagnosis not present

## 2018-04-19 DIAGNOSIS — I25118 Atherosclerotic heart disease of native coronary artery with other forms of angina pectoris: Secondary | ICD-10-CM | POA: Diagnosis not present

## 2018-04-19 DIAGNOSIS — I5042 Chronic combined systolic (congestive) and diastolic (congestive) heart failure: Secondary | ICD-10-CM | POA: Diagnosis not present

## 2018-04-19 DIAGNOSIS — R000 Tachycardia, unspecified: Secondary | ICD-10-CM | POA: Diagnosis not present

## 2018-04-19 DIAGNOSIS — N184 Chronic kidney disease, stage 4 (severe): Secondary | ICD-10-CM | POA: Diagnosis not present

## 2018-04-19 DIAGNOSIS — N186 End stage renal disease: Secondary | ICD-10-CM | POA: Diagnosis not present

## 2018-04-19 DIAGNOSIS — I10 Essential (primary) hypertension: Secondary | ICD-10-CM | POA: Diagnosis not present

## 2018-04-19 DIAGNOSIS — D649 Anemia, unspecified: Secondary | ICD-10-CM | POA: Diagnosis not present

## 2018-04-19 DIAGNOSIS — I132 Hypertensive heart and chronic kidney disease with heart failure and with stage 5 chronic kidney disease, or end stage renal disease: Secondary | ICD-10-CM | POA: Diagnosis not present

## 2018-04-19 DIAGNOSIS — R748 Abnormal levels of other serum enzymes: Secondary | ICD-10-CM | POA: Diagnosis not present

## 2018-04-19 DIAGNOSIS — R579 Shock, unspecified: Secondary | ICD-10-CM | POA: Diagnosis not present

## 2018-04-19 DIAGNOSIS — R570 Cardiogenic shock: Secondary | ICD-10-CM | POA: Diagnosis not present

## 2018-04-19 DIAGNOSIS — Z992 Dependence on renal dialysis: Secondary | ICD-10-CM | POA: Diagnosis not present

## 2018-04-19 DIAGNOSIS — I129 Hypertensive chronic kidney disease with stage 1 through stage 4 chronic kidney disease, or unspecified chronic kidney disease: Secondary | ICD-10-CM | POA: Diagnosis not present

## 2018-04-19 DIAGNOSIS — Z4682 Encounter for fitting and adjustment of non-vascular catheter: Secondary | ICD-10-CM | POA: Diagnosis not present

## 2018-04-19 DIAGNOSIS — K7200 Acute and subacute hepatic failure without coma: Secondary | ICD-10-CM | POA: Diagnosis not present

## 2018-04-19 DIAGNOSIS — I469 Cardiac arrest, cause unspecified: Secondary | ICD-10-CM | POA: Diagnosis not present

## 2018-04-19 DIAGNOSIS — R0902 Hypoxemia: Secondary | ICD-10-CM | POA: Diagnosis not present

## 2018-04-19 DIAGNOSIS — I2510 Atherosclerotic heart disease of native coronary artery without angina pectoris: Secondary | ICD-10-CM | POA: Diagnosis not present

## 2018-04-19 DIAGNOSIS — E162 Hypoglycemia, unspecified: Secondary | ICD-10-CM | POA: Diagnosis not present

## 2018-04-19 DIAGNOSIS — E872 Acidosis: Secondary | ICD-10-CM | POA: Diagnosis not present

## 2018-04-19 DIAGNOSIS — I272 Pulmonary hypertension, unspecified: Secondary | ICD-10-CM | POA: Diagnosis not present

## 2018-04-19 DIAGNOSIS — I4891 Unspecified atrial fibrillation: Secondary | ICD-10-CM | POA: Diagnosis not present

## 2018-04-19 DIAGNOSIS — I25119 Atherosclerotic heart disease of native coronary artery with unspecified angina pectoris: Secondary | ICD-10-CM | POA: Diagnosis not present

## 2018-04-19 DIAGNOSIS — I770 Arteriovenous fistula, acquired: Secondary | ICD-10-CM | POA: Diagnosis not present

## 2018-04-19 DIAGNOSIS — D631 Anemia in chronic kidney disease: Secondary | ICD-10-CM | POA: Diagnosis not present

## 2018-04-19 DIAGNOSIS — Z951 Presence of aortocoronary bypass graft: Secondary | ICD-10-CM | POA: Diagnosis not present

## 2018-04-19 DIAGNOSIS — N185 Chronic kidney disease, stage 5: Secondary | ICD-10-CM | POA: Diagnosis not present

## 2018-04-19 DIAGNOSIS — Z9889 Other specified postprocedural states: Secondary | ICD-10-CM | POA: Diagnosis not present

## 2018-04-19 DIAGNOSIS — J9601 Acute respiratory failure with hypoxia: Secondary | ICD-10-CM | POA: Diagnosis not present

## 2018-04-19 DIAGNOSIS — D62 Acute posthemorrhagic anemia: Secondary | ICD-10-CM | POA: Diagnosis not present

## 2018-04-19 DIAGNOSIS — R739 Hyperglycemia, unspecified: Secondary | ICD-10-CM | POA: Diagnosis not present

## 2018-04-19 DIAGNOSIS — N179 Acute kidney failure, unspecified: Secondary | ICD-10-CM | POA: Diagnosis not present

## 2018-04-19 DIAGNOSIS — I4819 Other persistent atrial fibrillation: Secondary | ICD-10-CM | POA: Diagnosis not present

## 2018-04-19 DIAGNOSIS — N189 Chronic kidney disease, unspecified: Secondary | ICD-10-CM | POA: Diagnosis not present

## 2018-04-19 DIAGNOSIS — Z79899 Other long term (current) drug therapy: Secondary | ICD-10-CM | POA: Diagnosis not present

## 2018-04-19 DIAGNOSIS — R4182 Altered mental status, unspecified: Secondary | ICD-10-CM | POA: Diagnosis not present

## 2018-04-19 DIAGNOSIS — E876 Hypokalemia: Secondary | ICD-10-CM | POA: Diagnosis not present

## 2018-04-20 DIAGNOSIS — N185 Chronic kidney disease, stage 5: Secondary | ICD-10-CM | POA: Diagnosis not present

## 2018-04-20 DIAGNOSIS — I120 Hypertensive chronic kidney disease with stage 5 chronic kidney disease or end stage renal disease: Secondary | ICD-10-CM | POA: Diagnosis not present

## 2018-04-20 DIAGNOSIS — I4891 Unspecified atrial fibrillation: Secondary | ICD-10-CM | POA: Diagnosis not present

## 2018-04-20 DIAGNOSIS — I517 Cardiomegaly: Secondary | ICD-10-CM | POA: Diagnosis not present

## 2018-04-20 DIAGNOSIS — Z01810 Encounter for preprocedural cardiovascular examination: Secondary | ICD-10-CM | POA: Diagnosis not present

## 2018-04-20 DIAGNOSIS — N186 End stage renal disease: Secondary | ICD-10-CM | POA: Diagnosis not present

## 2018-04-20 DIAGNOSIS — I25119 Atherosclerotic heart disease of native coronary artery with unspecified angina pectoris: Secondary | ICD-10-CM | POA: Diagnosis not present

## 2018-04-21 DIAGNOSIS — N184 Chronic kidney disease, stage 4 (severe): Secondary | ICD-10-CM | POA: Diagnosis not present

## 2018-04-21 DIAGNOSIS — I25118 Atherosclerotic heart disease of native coronary artery with other forms of angina pectoris: Secondary | ICD-10-CM | POA: Diagnosis not present

## 2018-04-21 DIAGNOSIS — I509 Heart failure, unspecified: Secondary | ICD-10-CM | POA: Diagnosis not present

## 2018-04-21 DIAGNOSIS — I517 Cardiomegaly: Secondary | ICD-10-CM | POA: Diagnosis not present

## 2018-04-21 DIAGNOSIS — Z87891 Personal history of nicotine dependence: Secondary | ICD-10-CM | POA: Diagnosis not present

## 2018-04-21 DIAGNOSIS — I4891 Unspecified atrial fibrillation: Secondary | ICD-10-CM | POA: Diagnosis not present

## 2018-04-21 DIAGNOSIS — I10 Essential (primary) hypertension: Secondary | ICD-10-CM | POA: Diagnosis not present

## 2018-04-21 DIAGNOSIS — I770 Arteriovenous fistula, acquired: Secondary | ICD-10-CM | POA: Diagnosis not present

## 2018-04-21 DIAGNOSIS — K922 Gastrointestinal hemorrhage, unspecified: Secondary | ICD-10-CM | POA: Diagnosis not present

## 2018-04-21 DIAGNOSIS — N185 Chronic kidney disease, stage 5: Secondary | ICD-10-CM | POA: Diagnosis not present

## 2018-04-21 DIAGNOSIS — R633 Feeding difficulties: Secondary | ICD-10-CM | POA: Diagnosis not present

## 2018-04-21 DIAGNOSIS — R739 Hyperglycemia, unspecified: Secondary | ICD-10-CM | POA: Diagnosis not present

## 2018-04-21 DIAGNOSIS — R760 Raised antibody titer: Secondary | ICD-10-CM | POA: Diagnosis not present

## 2018-04-21 DIAGNOSIS — E785 Hyperlipidemia, unspecified: Secondary | ICD-10-CM | POA: Diagnosis not present

## 2018-04-21 DIAGNOSIS — I120 Hypertensive chronic kidney disease with stage 5 chronic kidney disease or end stage renal disease: Secondary | ICD-10-CM | POA: Diagnosis not present

## 2018-04-21 DIAGNOSIS — Z95828 Presence of other vascular implants and grafts: Secondary | ICD-10-CM | POA: Diagnosis not present

## 2018-04-21 DIAGNOSIS — K740 Hepatic fibrosis: Secondary | ICD-10-CM | POA: Diagnosis not present

## 2018-04-22 DIAGNOSIS — I25118 Atherosclerotic heart disease of native coronary artery with other forms of angina pectoris: Secondary | ICD-10-CM | POA: Diagnosis not present

## 2018-04-22 DIAGNOSIS — E785 Hyperlipidemia, unspecified: Secondary | ICD-10-CM | POA: Diagnosis not present

## 2018-04-22 DIAGNOSIS — I10 Essential (primary) hypertension: Secondary | ICD-10-CM | POA: Diagnosis not present

## 2018-04-22 DIAGNOSIS — E876 Hypokalemia: Secondary | ICD-10-CM | POA: Diagnosis not present

## 2018-04-22 DIAGNOSIS — R739 Hyperglycemia, unspecified: Secondary | ICD-10-CM | POA: Diagnosis not present

## 2018-04-22 DIAGNOSIS — N189 Chronic kidney disease, unspecified: Secondary | ICD-10-CM | POA: Diagnosis not present

## 2018-04-22 DIAGNOSIS — I770 Arteriovenous fistula, acquired: Secondary | ICD-10-CM | POA: Diagnosis not present

## 2018-04-22 DIAGNOSIS — N185 Chronic kidney disease, stage 5: Secondary | ICD-10-CM | POA: Diagnosis not present

## 2018-04-22 DIAGNOSIS — K740 Hepatic fibrosis: Secondary | ICD-10-CM | POA: Diagnosis not present

## 2018-04-23 DIAGNOSIS — N179 Acute kidney failure, unspecified: Secondary | ICD-10-CM | POA: Diagnosis not present

## 2018-04-23 DIAGNOSIS — I132 Hypertensive heart and chronic kidney disease with heart failure and with stage 5 chronic kidney disease, or end stage renal disease: Secondary | ICD-10-CM | POA: Diagnosis not present

## 2018-04-23 DIAGNOSIS — I2510 Atherosclerotic heart disease of native coronary artery without angina pectoris: Secondary | ICD-10-CM | POA: Diagnosis not present

## 2018-04-23 DIAGNOSIS — R0603 Acute respiratory distress: Secondary | ICD-10-CM | POA: Diagnosis not present

## 2018-04-23 DIAGNOSIS — I4819 Other persistent atrial fibrillation: Secondary | ICD-10-CM | POA: Diagnosis not present

## 2018-04-23 DIAGNOSIS — I25118 Atherosclerotic heart disease of native coronary artery with other forms of angina pectoris: Secondary | ICD-10-CM | POA: Diagnosis not present

## 2018-04-23 DIAGNOSIS — I129 Hypertensive chronic kidney disease with stage 1 through stage 4 chronic kidney disease, or unspecified chronic kidney disease: Secondary | ICD-10-CM | POA: Diagnosis not present

## 2018-04-23 DIAGNOSIS — I471 Supraventricular tachycardia: Secondary | ICD-10-CM | POA: Diagnosis not present

## 2018-04-23 DIAGNOSIS — Z79899 Other long term (current) drug therapy: Secondary | ICD-10-CM | POA: Diagnosis not present

## 2018-04-23 DIAGNOSIS — D649 Anemia, unspecified: Secondary | ICD-10-CM | POA: Diagnosis not present

## 2018-04-23 DIAGNOSIS — I5042 Chronic combined systolic (congestive) and diastolic (congestive) heart failure: Secondary | ICD-10-CM | POA: Diagnosis not present

## 2018-04-23 DIAGNOSIS — I97120 Postprocedural cardiac arrest following cardiac surgery: Secondary | ICD-10-CM | POA: Diagnosis not present

## 2018-04-23 DIAGNOSIS — N185 Chronic kidney disease, stage 5: Secondary | ICD-10-CM | POA: Diagnosis not present

## 2018-04-23 DIAGNOSIS — N189 Chronic kidney disease, unspecified: Secondary | ICD-10-CM | POA: Diagnosis not present

## 2018-04-24 DIAGNOSIS — D649 Anemia, unspecified: Secondary | ICD-10-CM | POA: Diagnosis not present

## 2018-04-24 DIAGNOSIS — I2510 Atherosclerotic heart disease of native coronary artery without angina pectoris: Secondary | ICD-10-CM | POA: Diagnosis not present

## 2018-04-24 DIAGNOSIS — N184 Chronic kidney disease, stage 4 (severe): Secondary | ICD-10-CM | POA: Diagnosis not present

## 2018-04-24 DIAGNOSIS — I129 Hypertensive chronic kidney disease with stage 1 through stage 4 chronic kidney disease, or unspecified chronic kidney disease: Secondary | ICD-10-CM | POA: Diagnosis not present

## 2018-04-25 DIAGNOSIS — R918 Other nonspecific abnormal finding of lung field: Secondary | ICD-10-CM | POA: Diagnosis not present

## 2018-04-25 DIAGNOSIS — I25118 Atherosclerotic heart disease of native coronary artery with other forms of angina pectoris: Secondary | ICD-10-CM | POA: Diagnosis not present

## 2018-04-25 DIAGNOSIS — I770 Arteriovenous fistula, acquired: Secondary | ICD-10-CM | POA: Diagnosis not present

## 2018-04-25 DIAGNOSIS — Z992 Dependence on renal dialysis: Secondary | ICD-10-CM | POA: Diagnosis not present

## 2018-04-25 DIAGNOSIS — I509 Heart failure, unspecified: Secondary | ICD-10-CM | POA: Diagnosis not present

## 2018-04-25 DIAGNOSIS — I517 Cardiomegaly: Secondary | ICD-10-CM | POA: Diagnosis not present

## 2018-04-25 DIAGNOSIS — E785 Hyperlipidemia, unspecified: Secondary | ICD-10-CM | POA: Diagnosis not present

## 2018-04-25 DIAGNOSIS — N185 Chronic kidney disease, stage 5: Secondary | ICD-10-CM | POA: Diagnosis not present

## 2018-04-25 DIAGNOSIS — I10 Essential (primary) hypertension: Secondary | ICD-10-CM | POA: Diagnosis not present

## 2018-04-25 DIAGNOSIS — Z452 Encounter for adjustment and management of vascular access device: Secondary | ICD-10-CM | POA: Diagnosis not present

## 2018-04-25 DIAGNOSIS — Z95828 Presence of other vascular implants and grafts: Secondary | ICD-10-CM | POA: Diagnosis not present

## 2018-04-25 DIAGNOSIS — E876 Hypokalemia: Secondary | ICD-10-CM | POA: Diagnosis not present

## 2018-04-25 DIAGNOSIS — I129 Hypertensive chronic kidney disease with stage 1 through stage 4 chronic kidney disease, or unspecified chronic kidney disease: Secondary | ICD-10-CM | POA: Diagnosis not present

## 2018-04-25 DIAGNOSIS — N189 Chronic kidney disease, unspecified: Secondary | ICD-10-CM | POA: Diagnosis not present

## 2018-04-25 DIAGNOSIS — K740 Hepatic fibrosis: Secondary | ICD-10-CM | POA: Diagnosis not present

## 2018-04-25 DIAGNOSIS — R739 Hyperglycemia, unspecified: Secondary | ICD-10-CM | POA: Diagnosis not present

## 2018-04-25 DIAGNOSIS — N186 End stage renal disease: Secondary | ICD-10-CM | POA: Diagnosis not present

## 2018-04-25 DIAGNOSIS — I120 Hypertensive chronic kidney disease with stage 5 chronic kidney disease or end stage renal disease: Secondary | ICD-10-CM | POA: Diagnosis not present

## 2018-04-26 DIAGNOSIS — D62 Acute posthemorrhagic anemia: Secondary | ICD-10-CM | POA: Diagnosis not present

## 2018-04-26 DIAGNOSIS — R52 Pain, unspecified: Secondary | ICD-10-CM | POA: Diagnosis not present

## 2018-04-26 DIAGNOSIS — I272 Pulmonary hypertension, unspecified: Secondary | ICD-10-CM | POA: Diagnosis not present

## 2018-04-26 DIAGNOSIS — N186 End stage renal disease: Secondary | ICD-10-CM | POA: Diagnosis not present

## 2018-04-26 DIAGNOSIS — Z992 Dependence on renal dialysis: Secondary | ICD-10-CM | POA: Diagnosis not present

## 2018-04-26 DIAGNOSIS — I2510 Atherosclerotic heart disease of native coronary artery without angina pectoris: Secondary | ICD-10-CM | POA: Diagnosis not present

## 2018-04-26 DIAGNOSIS — I770 Arteriovenous fistula, acquired: Secondary | ICD-10-CM | POA: Diagnosis not present

## 2018-04-26 DIAGNOSIS — I480 Paroxysmal atrial fibrillation: Secondary | ICD-10-CM | POA: Diagnosis not present

## 2018-04-26 DIAGNOSIS — R739 Hyperglycemia, unspecified: Secondary | ICD-10-CM | POA: Diagnosis not present

## 2018-04-26 DIAGNOSIS — D631 Anemia in chronic kidney disease: Secondary | ICD-10-CM | POA: Diagnosis not present

## 2018-04-26 DIAGNOSIS — I4891 Unspecified atrial fibrillation: Secondary | ICD-10-CM | POA: Diagnosis not present

## 2018-04-26 DIAGNOSIS — E876 Hypokalemia: Secondary | ICD-10-CM | POA: Diagnosis not present

## 2018-04-26 DIAGNOSIS — N189 Chronic kidney disease, unspecified: Secondary | ICD-10-CM | POA: Diagnosis not present

## 2018-04-26 DIAGNOSIS — Z4682 Encounter for fitting and adjustment of non-vascular catheter: Secondary | ICD-10-CM | POA: Diagnosis not present

## 2018-04-26 DIAGNOSIS — I129 Hypertensive chronic kidney disease with stage 1 through stage 4 chronic kidney disease, or unspecified chronic kidney disease: Secondary | ICD-10-CM | POA: Diagnosis not present

## 2018-04-26 DIAGNOSIS — J811 Chronic pulmonary edema: Secondary | ICD-10-CM | POA: Diagnosis not present

## 2018-04-26 DIAGNOSIS — I517 Cardiomegaly: Secondary | ICD-10-CM | POA: Diagnosis not present

## 2018-04-27 DIAGNOSIS — R570 Cardiogenic shock: Secondary | ICD-10-CM | POA: Diagnosis not present

## 2018-04-27 DIAGNOSIS — I471 Supraventricular tachycardia: Secondary | ICD-10-CM | POA: Diagnosis not present

## 2018-04-27 DIAGNOSIS — J9601 Acute respiratory failure with hypoxia: Secondary | ICD-10-CM | POA: Diagnosis not present

## 2018-04-27 DIAGNOSIS — J90 Pleural effusion, not elsewhere classified: Secondary | ICD-10-CM | POA: Diagnosis not present

## 2018-04-27 DIAGNOSIS — R55 Syncope and collapse: Secondary | ICD-10-CM | POA: Diagnosis not present

## 2018-04-27 DIAGNOSIS — I469 Cardiac arrest, cause unspecified: Secondary | ICD-10-CM | POA: Diagnosis not present

## 2018-04-27 DIAGNOSIS — I770 Arteriovenous fistula, acquired: Secondary | ICD-10-CM | POA: Diagnosis not present

## 2018-04-27 DIAGNOSIS — R932 Abnormal findings on diagnostic imaging of liver and biliary tract: Secondary | ICD-10-CM | POA: Diagnosis not present

## 2018-04-27 DIAGNOSIS — R0602 Shortness of breath: Secondary | ICD-10-CM | POA: Diagnosis not present

## 2018-04-27 DIAGNOSIS — R748 Abnormal levels of other serum enzymes: Secondary | ICD-10-CM | POA: Diagnosis not present

## 2018-04-27 DIAGNOSIS — R739 Hyperglycemia, unspecified: Secondary | ICD-10-CM | POA: Diagnosis not present

## 2018-04-27 DIAGNOSIS — I25119 Atherosclerotic heart disease of native coronary artery with unspecified angina pectoris: Secondary | ICD-10-CM | POA: Diagnosis not present

## 2018-04-27 DIAGNOSIS — Z992 Dependence on renal dialysis: Secondary | ICD-10-CM | POA: Diagnosis not present

## 2018-04-27 DIAGNOSIS — R4182 Altered mental status, unspecified: Secondary | ICD-10-CM | POA: Diagnosis not present

## 2018-04-27 DIAGNOSIS — G4733 Obstructive sleep apnea (adult) (pediatric): Secondary | ICD-10-CM | POA: Diagnosis not present

## 2018-04-27 DIAGNOSIS — N179 Acute kidney failure, unspecified: Secondary | ICD-10-CM | POA: Diagnosis not present

## 2018-04-27 DIAGNOSIS — I132 Hypertensive heart and chronic kidney disease with heart failure and with stage 5 chronic kidney disease, or end stage renal disease: Secondary | ICD-10-CM | POA: Diagnosis not present

## 2018-04-27 DIAGNOSIS — K7460 Unspecified cirrhosis of liver: Secondary | ICD-10-CM | POA: Diagnosis not present

## 2018-04-27 DIAGNOSIS — Z4682 Encounter for fitting and adjustment of non-vascular catheter: Secondary | ICD-10-CM | POA: Diagnosis not present

## 2018-04-27 DIAGNOSIS — K7200 Acute and subacute hepatic failure without coma: Secondary | ICD-10-CM | POA: Diagnosis not present

## 2018-04-27 DIAGNOSIS — Z951 Presence of aortocoronary bypass graft: Secondary | ICD-10-CM | POA: Diagnosis not present

## 2018-04-27 DIAGNOSIS — R579 Shock, unspecified: Secondary | ICD-10-CM | POA: Diagnosis not present

## 2018-04-27 DIAGNOSIS — J9602 Acute respiratory failure with hypercapnia: Secondary | ICD-10-CM | POA: Diagnosis not present

## 2018-04-27 DIAGNOSIS — E162 Hypoglycemia, unspecified: Secondary | ICD-10-CM | POA: Diagnosis not present

## 2018-04-27 DIAGNOSIS — Z452 Encounter for adjustment and management of vascular access device: Secondary | ICD-10-CM | POA: Diagnosis not present

## 2018-04-27 DIAGNOSIS — R0902 Hypoxemia: Secondary | ICD-10-CM | POA: Diagnosis not present

## 2018-04-27 DIAGNOSIS — R918 Other nonspecific abnormal finding of lung field: Secondary | ICD-10-CM | POA: Diagnosis not present

## 2018-04-27 DIAGNOSIS — N281 Cyst of kidney, acquired: Secondary | ICD-10-CM | POA: Diagnosis not present

## 2018-04-27 DIAGNOSIS — J9692 Respiratory failure, unspecified with hypercapnia: Secondary | ICD-10-CM | POA: Diagnosis not present

## 2018-04-27 DIAGNOSIS — R000 Tachycardia, unspecified: Secondary | ICD-10-CM | POA: Diagnosis not present

## 2018-04-27 DIAGNOSIS — I129 Hypertensive chronic kidney disease with stage 1 through stage 4 chronic kidney disease, or unspecified chronic kidney disease: Secondary | ICD-10-CM | POA: Diagnosis not present

## 2018-04-27 DIAGNOSIS — I4891 Unspecified atrial fibrillation: Secondary | ICD-10-CM | POA: Diagnosis not present

## 2018-04-27 DIAGNOSIS — D62 Acute posthemorrhagic anemia: Secondary | ICD-10-CM | POA: Diagnosis not present

## 2018-04-27 DIAGNOSIS — N186 End stage renal disease: Secondary | ICD-10-CM | POA: Diagnosis not present

## 2018-04-27 DIAGNOSIS — E875 Hyperkalemia: Secondary | ICD-10-CM | POA: Diagnosis not present

## 2018-04-27 DIAGNOSIS — E872 Acidosis: Secondary | ICD-10-CM | POA: Diagnosis not present

## 2018-04-27 DIAGNOSIS — Z9889 Other specified postprocedural states: Secondary | ICD-10-CM | POA: Diagnosis not present

## 2018-04-27 DIAGNOSIS — I517 Cardiomegaly: Secondary | ICD-10-CM | POA: Diagnosis not present

## 2018-04-27 DIAGNOSIS — I25118 Atherosclerotic heart disease of native coronary artery with other forms of angina pectoris: Secondary | ICD-10-CM | POA: Diagnosis not present

## 2018-04-27 DIAGNOSIS — E876 Hypokalemia: Secondary | ICD-10-CM | POA: Diagnosis not present

## 2018-04-27 DIAGNOSIS — N185 Chronic kidney disease, stage 5: Secondary | ICD-10-CM | POA: Diagnosis not present

## 2018-04-27 DIAGNOSIS — N189 Chronic kidney disease, unspecified: Secondary | ICD-10-CM | POA: Diagnosis not present

## 2018-04-27 DIAGNOSIS — I4892 Unspecified atrial flutter: Secondary | ICD-10-CM | POA: Diagnosis not present

## 2018-04-27 DIAGNOSIS — Z4803 Encounter for change or removal of drains: Secondary | ICD-10-CM | POA: Diagnosis not present

## 2018-04-27 DIAGNOSIS — D684 Acquired coagulation factor deficiency: Secondary | ICD-10-CM | POA: Diagnosis not present

## 2018-04-27 DIAGNOSIS — T503X5A Adverse effect of electrolytic, caloric and water-balance agents, initial encounter: Secondary | ICD-10-CM | POA: Diagnosis not present

## 2018-04-27 DIAGNOSIS — I272 Pulmonary hypertension, unspecified: Secondary | ICD-10-CM | POA: Diagnosis not present

## 2018-04-27 DIAGNOSIS — R52 Pain, unspecified: Secondary | ICD-10-CM | POA: Diagnosis not present

## 2018-04-27 DIAGNOSIS — R7309 Other abnormal glucose: Secondary | ICD-10-CM | POA: Diagnosis not present

## 2018-04-27 DIAGNOSIS — J811 Chronic pulmonary edema: Secondary | ICD-10-CM | POA: Diagnosis not present

## 2018-04-27 DIAGNOSIS — N184 Chronic kidney disease, stage 4 (severe): Secondary | ICD-10-CM | POA: Diagnosis not present

## 2018-04-27 DIAGNOSIS — J9811 Atelectasis: Secondary | ICD-10-CM | POA: Diagnosis not present

## 2018-04-28 DIAGNOSIS — N189 Chronic kidney disease, unspecified: Secondary | ICD-10-CM | POA: Diagnosis not present

## 2018-04-28 DIAGNOSIS — R52 Pain, unspecified: Secondary | ICD-10-CM | POA: Diagnosis not present

## 2018-04-28 DIAGNOSIS — I272 Pulmonary hypertension, unspecified: Secondary | ICD-10-CM | POA: Diagnosis not present

## 2018-04-28 DIAGNOSIS — I25118 Atherosclerotic heart disease of native coronary artery with other forms of angina pectoris: Secondary | ICD-10-CM | POA: Diagnosis not present

## 2018-04-28 DIAGNOSIS — Z452 Encounter for adjustment and management of vascular access device: Secondary | ICD-10-CM | POA: Diagnosis not present

## 2018-04-28 DIAGNOSIS — G4733 Obstructive sleep apnea (adult) (pediatric): Secondary | ICD-10-CM | POA: Diagnosis not present

## 2018-04-28 DIAGNOSIS — I129 Hypertensive chronic kidney disease with stage 1 through stage 4 chronic kidney disease, or unspecified chronic kidney disease: Secondary | ICD-10-CM | POA: Diagnosis not present

## 2018-04-28 DIAGNOSIS — I770 Arteriovenous fistula, acquired: Secondary | ICD-10-CM | POA: Diagnosis not present

## 2018-04-28 DIAGNOSIS — I4891 Unspecified atrial fibrillation: Secondary | ICD-10-CM | POA: Diagnosis not present

## 2018-04-28 DIAGNOSIS — R739 Hyperglycemia, unspecified: Secondary | ICD-10-CM | POA: Diagnosis not present

## 2018-04-28 DIAGNOSIS — D62 Acute posthemorrhagic anemia: Secondary | ICD-10-CM | POA: Diagnosis not present

## 2018-04-28 DIAGNOSIS — N179 Acute kidney failure, unspecified: Secondary | ICD-10-CM | POA: Diagnosis not present

## 2018-04-28 DIAGNOSIS — E876 Hypokalemia: Secondary | ICD-10-CM | POA: Diagnosis not present

## 2018-04-29 DIAGNOSIS — I272 Pulmonary hypertension, unspecified: Secondary | ICD-10-CM | POA: Diagnosis not present

## 2018-04-29 DIAGNOSIS — N179 Acute kidney failure, unspecified: Secondary | ICD-10-CM | POA: Diagnosis not present

## 2018-04-29 DIAGNOSIS — G4733 Obstructive sleep apnea (adult) (pediatric): Secondary | ICD-10-CM | POA: Diagnosis not present

## 2018-04-29 DIAGNOSIS — R739 Hyperglycemia, unspecified: Secondary | ICD-10-CM | POA: Diagnosis not present

## 2018-04-29 DIAGNOSIS — I517 Cardiomegaly: Secondary | ICD-10-CM | POA: Diagnosis not present

## 2018-04-29 DIAGNOSIS — Z951 Presence of aortocoronary bypass graft: Secondary | ICD-10-CM | POA: Diagnosis not present

## 2018-04-29 DIAGNOSIS — R570 Cardiogenic shock: Secondary | ICD-10-CM | POA: Diagnosis not present

## 2018-04-29 DIAGNOSIS — N189 Chronic kidney disease, unspecified: Secondary | ICD-10-CM | POA: Diagnosis not present

## 2018-04-29 DIAGNOSIS — D62 Acute posthemorrhagic anemia: Secondary | ICD-10-CM | POA: Diagnosis not present

## 2018-04-29 DIAGNOSIS — I129 Hypertensive chronic kidney disease with stage 1 through stage 4 chronic kidney disease, or unspecified chronic kidney disease: Secondary | ICD-10-CM | POA: Diagnosis not present

## 2018-04-29 DIAGNOSIS — Z4682 Encounter for fitting and adjustment of non-vascular catheter: Secondary | ICD-10-CM | POA: Diagnosis not present

## 2018-04-29 DIAGNOSIS — E876 Hypokalemia: Secondary | ICD-10-CM | POA: Diagnosis not present

## 2018-04-29 DIAGNOSIS — R000 Tachycardia, unspecified: Secondary | ICD-10-CM | POA: Diagnosis not present

## 2018-04-29 DIAGNOSIS — N185 Chronic kidney disease, stage 5: Secondary | ICD-10-CM | POA: Diagnosis not present

## 2018-04-29 DIAGNOSIS — I4891 Unspecified atrial fibrillation: Secondary | ICD-10-CM | POA: Diagnosis not present

## 2018-04-29 DIAGNOSIS — Z992 Dependence on renal dialysis: Secondary | ICD-10-CM | POA: Diagnosis not present

## 2018-04-29 DIAGNOSIS — R918 Other nonspecific abnormal finding of lung field: Secondary | ICD-10-CM | POA: Diagnosis not present

## 2018-04-30 DIAGNOSIS — J90 Pleural effusion, not elsewhere classified: Secondary | ICD-10-CM | POA: Diagnosis not present

## 2018-04-30 DIAGNOSIS — R918 Other nonspecific abnormal finding of lung field: Secondary | ICD-10-CM | POA: Diagnosis not present

## 2018-05-01 DIAGNOSIS — J90 Pleural effusion, not elsewhere classified: Secondary | ICD-10-CM | POA: Diagnosis not present

## 2018-05-01 DIAGNOSIS — Z4803 Encounter for change or removal of drains: Secondary | ICD-10-CM | POA: Diagnosis not present

## 2018-05-01 DIAGNOSIS — N179 Acute kidney failure, unspecified: Secondary | ICD-10-CM | POA: Diagnosis not present

## 2018-05-01 DIAGNOSIS — R918 Other nonspecific abnormal finding of lung field: Secondary | ICD-10-CM | POA: Diagnosis not present

## 2018-05-02 DIAGNOSIS — N185 Chronic kidney disease, stage 5: Secondary | ICD-10-CM | POA: Diagnosis not present

## 2018-05-02 DIAGNOSIS — R739 Hyperglycemia, unspecified: Secondary | ICD-10-CM | POA: Diagnosis not present

## 2018-05-02 DIAGNOSIS — N179 Acute kidney failure, unspecified: Secondary | ICD-10-CM | POA: Diagnosis not present

## 2018-05-02 DIAGNOSIS — R918 Other nonspecific abnormal finding of lung field: Secondary | ICD-10-CM | POA: Diagnosis not present

## 2018-05-02 DIAGNOSIS — Z992 Dependence on renal dialysis: Secondary | ICD-10-CM | POA: Diagnosis not present

## 2018-05-02 DIAGNOSIS — Z452 Encounter for adjustment and management of vascular access device: Secondary | ICD-10-CM | POA: Diagnosis not present

## 2018-05-02 DIAGNOSIS — E876 Hypokalemia: Secondary | ICD-10-CM | POA: Diagnosis not present

## 2018-05-02 DIAGNOSIS — I129 Hypertensive chronic kidney disease with stage 1 through stage 4 chronic kidney disease, or unspecified chronic kidney disease: Secondary | ICD-10-CM | POA: Diagnosis not present

## 2018-05-02 DIAGNOSIS — D62 Acute posthemorrhagic anemia: Secondary | ICD-10-CM | POA: Diagnosis not present

## 2018-05-02 DIAGNOSIS — Z951 Presence of aortocoronary bypass graft: Secondary | ICD-10-CM | POA: Diagnosis not present

## 2018-05-02 DIAGNOSIS — N189 Chronic kidney disease, unspecified: Secondary | ICD-10-CM | POA: Diagnosis not present

## 2018-05-03 DIAGNOSIS — R579 Shock, unspecified: Secondary | ICD-10-CM | POA: Diagnosis not present

## 2018-05-03 DIAGNOSIS — J9692 Respiratory failure, unspecified with hypercapnia: Secondary | ICD-10-CM | POA: Diagnosis not present

## 2018-05-03 DIAGNOSIS — J9811 Atelectasis: Secondary | ICD-10-CM | POA: Diagnosis not present

## 2018-05-03 DIAGNOSIS — J9601 Acute respiratory failure with hypoxia: Secondary | ICD-10-CM | POA: Diagnosis not present

## 2018-05-03 DIAGNOSIS — Z992 Dependence on renal dialysis: Secondary | ICD-10-CM | POA: Diagnosis not present

## 2018-05-03 DIAGNOSIS — Z4682 Encounter for fitting and adjustment of non-vascular catheter: Secondary | ICD-10-CM | POA: Diagnosis not present

## 2018-05-03 DIAGNOSIS — E872 Acidosis: Secondary | ICD-10-CM | POA: Diagnosis not present

## 2018-05-03 DIAGNOSIS — N189 Chronic kidney disease, unspecified: Secondary | ICD-10-CM | POA: Diagnosis not present

## 2018-05-03 DIAGNOSIS — R739 Hyperglycemia, unspecified: Secondary | ICD-10-CM | POA: Diagnosis not present

## 2018-05-03 DIAGNOSIS — R4182 Altered mental status, unspecified: Secondary | ICD-10-CM | POA: Diagnosis not present

## 2018-05-03 DIAGNOSIS — I4891 Unspecified atrial fibrillation: Secondary | ICD-10-CM | POA: Diagnosis not present

## 2018-05-03 DIAGNOSIS — Z9889 Other specified postprocedural states: Secondary | ICD-10-CM | POA: Diagnosis not present

## 2018-05-03 DIAGNOSIS — N184 Chronic kidney disease, stage 4 (severe): Secondary | ICD-10-CM | POA: Diagnosis not present

## 2018-05-03 DIAGNOSIS — D62 Acute posthemorrhagic anemia: Secondary | ICD-10-CM | POA: Diagnosis not present

## 2018-05-03 DIAGNOSIS — R0902 Hypoxemia: Secondary | ICD-10-CM | POA: Diagnosis not present

## 2018-05-03 DIAGNOSIS — Z452 Encounter for adjustment and management of vascular access device: Secondary | ICD-10-CM | POA: Diagnosis not present

## 2018-05-03 DIAGNOSIS — I129 Hypertensive chronic kidney disease with stage 1 through stage 4 chronic kidney disease, or unspecified chronic kidney disease: Secondary | ICD-10-CM | POA: Diagnosis not present

## 2018-05-03 DIAGNOSIS — I770 Arteriovenous fistula, acquired: Secondary | ICD-10-CM | POA: Diagnosis not present

## 2018-05-03 DIAGNOSIS — E876 Hypokalemia: Secondary | ICD-10-CM | POA: Diagnosis not present

## 2018-05-03 DIAGNOSIS — R570 Cardiogenic shock: Secondary | ICD-10-CM | POA: Diagnosis not present

## 2018-05-03 DIAGNOSIS — N179 Acute kidney failure, unspecified: Secondary | ICD-10-CM | POA: Diagnosis not present

## 2018-05-03 DIAGNOSIS — R0602 Shortness of breath: Secondary | ICD-10-CM | POA: Diagnosis not present

## 2018-05-04 DIAGNOSIS — J811 Chronic pulmonary edema: Secondary | ICD-10-CM | POA: Diagnosis not present

## 2018-05-04 DIAGNOSIS — R739 Hyperglycemia, unspecified: Secondary | ICD-10-CM | POA: Diagnosis not present

## 2018-05-04 DIAGNOSIS — I4891 Unspecified atrial fibrillation: Secondary | ICD-10-CM | POA: Diagnosis not present

## 2018-05-04 DIAGNOSIS — N281 Cyst of kidney, acquired: Secondary | ICD-10-CM | POA: Diagnosis not present

## 2018-05-04 DIAGNOSIS — E872 Acidosis: Secondary | ICD-10-CM | POA: Diagnosis not present

## 2018-05-04 DIAGNOSIS — J9811 Atelectasis: Secondary | ICD-10-CM | POA: Diagnosis not present

## 2018-05-04 DIAGNOSIS — Z951 Presence of aortocoronary bypass graft: Secondary | ICD-10-CM | POA: Diagnosis not present

## 2018-05-04 DIAGNOSIS — R918 Other nonspecific abnormal finding of lung field: Secondary | ICD-10-CM | POA: Diagnosis not present

## 2018-05-04 DIAGNOSIS — R932 Abnormal findings on diagnostic imaging of liver and biliary tract: Secondary | ICD-10-CM | POA: Diagnosis not present

## 2018-05-04 DIAGNOSIS — K7200 Acute and subacute hepatic failure without coma: Secondary | ICD-10-CM | POA: Diagnosis not present

## 2018-05-04 DIAGNOSIS — T503X5A Adverse effect of electrolytic, caloric and water-balance agents, initial encounter: Secondary | ICD-10-CM | POA: Diagnosis not present

## 2018-05-04 DIAGNOSIS — N184 Chronic kidney disease, stage 4 (severe): Secondary | ICD-10-CM | POA: Diagnosis not present

## 2018-05-04 DIAGNOSIS — R4182 Altered mental status, unspecified: Secondary | ICD-10-CM | POA: Diagnosis not present

## 2018-05-04 DIAGNOSIS — D62 Acute posthemorrhagic anemia: Secondary | ICD-10-CM | POA: Diagnosis not present

## 2018-05-04 DIAGNOSIS — N189 Chronic kidney disease, unspecified: Secondary | ICD-10-CM | POA: Diagnosis not present

## 2018-05-04 DIAGNOSIS — R55 Syncope and collapse: Secondary | ICD-10-CM | POA: Diagnosis not present

## 2018-05-04 DIAGNOSIS — R570 Cardiogenic shock: Secondary | ICD-10-CM | POA: Diagnosis not present

## 2018-05-04 DIAGNOSIS — R748 Abnormal levels of other serum enzymes: Secondary | ICD-10-CM | POA: Diagnosis not present

## 2018-05-04 DIAGNOSIS — N179 Acute kidney failure, unspecified: Secondary | ICD-10-CM | POA: Diagnosis not present

## 2018-05-04 DIAGNOSIS — Z452 Encounter for adjustment and management of vascular access device: Secondary | ICD-10-CM | POA: Diagnosis not present

## 2018-05-04 DIAGNOSIS — K7460 Unspecified cirrhosis of liver: Secondary | ICD-10-CM | POA: Diagnosis not present

## 2018-05-04 DIAGNOSIS — I272 Pulmonary hypertension, unspecified: Secondary | ICD-10-CM | POA: Diagnosis not present

## 2018-05-04 DIAGNOSIS — I129 Hypertensive chronic kidney disease with stage 1 through stage 4 chronic kidney disease, or unspecified chronic kidney disease: Secondary | ICD-10-CM | POA: Diagnosis not present

## 2018-05-04 DIAGNOSIS — J9601 Acute respiratory failure with hypoxia: Secondary | ICD-10-CM | POA: Diagnosis not present

## 2018-05-04 DIAGNOSIS — Z992 Dependence on renal dialysis: Secondary | ICD-10-CM | POA: Diagnosis not present

## 2018-05-04 DIAGNOSIS — E875 Hyperkalemia: Secondary | ICD-10-CM | POA: Diagnosis not present

## 2018-05-05 DIAGNOSIS — R0902 Hypoxemia: Secondary | ICD-10-CM | POA: Diagnosis not present

## 2018-05-05 DIAGNOSIS — N179 Acute kidney failure, unspecified: Secondary | ICD-10-CM | POA: Diagnosis not present

## 2018-05-05 DIAGNOSIS — R570 Cardiogenic shock: Secondary | ICD-10-CM | POA: Diagnosis not present

## 2018-05-05 DIAGNOSIS — R748 Abnormal levels of other serum enzymes: Secondary | ICD-10-CM | POA: Diagnosis not present

## 2018-05-05 DIAGNOSIS — E876 Hypokalemia: Secondary | ICD-10-CM | POA: Diagnosis not present

## 2018-05-05 DIAGNOSIS — K7200 Acute and subacute hepatic failure without coma: Secondary | ICD-10-CM | POA: Diagnosis not present

## 2018-05-05 DIAGNOSIS — J9601 Acute respiratory failure with hypoxia: Secondary | ICD-10-CM | POA: Diagnosis not present

## 2018-05-05 DIAGNOSIS — I129 Hypertensive chronic kidney disease with stage 1 through stage 4 chronic kidney disease, or unspecified chronic kidney disease: Secondary | ICD-10-CM | POA: Diagnosis not present

## 2018-05-05 DIAGNOSIS — R918 Other nonspecific abnormal finding of lung field: Secondary | ICD-10-CM | POA: Diagnosis not present

## 2018-05-05 DIAGNOSIS — R7309 Other abnormal glucose: Secondary | ICD-10-CM | POA: Diagnosis not present

## 2018-05-05 DIAGNOSIS — N184 Chronic kidney disease, stage 4 (severe): Secondary | ICD-10-CM | POA: Diagnosis not present

## 2018-05-05 DIAGNOSIS — I4891 Unspecified atrial fibrillation: Secondary | ICD-10-CM | POA: Diagnosis not present

## 2018-05-05 DIAGNOSIS — E872 Acidosis: Secondary | ICD-10-CM | POA: Diagnosis not present

## 2018-05-05 DIAGNOSIS — N189 Chronic kidney disease, unspecified: Secondary | ICD-10-CM | POA: Diagnosis not present

## 2018-05-05 DIAGNOSIS — D62 Acute posthemorrhagic anemia: Secondary | ICD-10-CM | POA: Diagnosis not present

## 2018-05-05 DIAGNOSIS — J9811 Atelectasis: Secondary | ICD-10-CM | POA: Diagnosis not present

## 2018-05-05 DIAGNOSIS — Z992 Dependence on renal dialysis: Secondary | ICD-10-CM | POA: Diagnosis not present

## 2018-05-05 DIAGNOSIS — I272 Pulmonary hypertension, unspecified: Secondary | ICD-10-CM | POA: Diagnosis not present

## 2018-05-06 DIAGNOSIS — I469 Cardiac arrest, cause unspecified: Secondary | ICD-10-CM | POA: Diagnosis not present

## 2018-05-06 DIAGNOSIS — R579 Shock, unspecified: Secondary | ICD-10-CM | POA: Diagnosis not present

## 2018-05-06 DIAGNOSIS — J9601 Acute respiratory failure with hypoxia: Secondary | ICD-10-CM | POA: Diagnosis not present

## 2018-05-06 DIAGNOSIS — I129 Hypertensive chronic kidney disease with stage 1 through stage 4 chronic kidney disease, or unspecified chronic kidney disease: Secondary | ICD-10-CM | POA: Diagnosis not present

## 2018-05-06 DIAGNOSIS — R748 Abnormal levels of other serum enzymes: Secondary | ICD-10-CM | POA: Diagnosis not present

## 2018-05-06 DIAGNOSIS — E162 Hypoglycemia, unspecified: Secondary | ICD-10-CM | POA: Diagnosis not present

## 2018-05-06 DIAGNOSIS — Z992 Dependence on renal dialysis: Secondary | ICD-10-CM | POA: Diagnosis not present

## 2018-05-06 DIAGNOSIS — K7200 Acute and subacute hepatic failure without coma: Secondary | ICD-10-CM | POA: Diagnosis not present

## 2018-05-06 DIAGNOSIS — N189 Chronic kidney disease, unspecified: Secondary | ICD-10-CM | POA: Diagnosis not present

## 2018-05-06 DIAGNOSIS — E876 Hypokalemia: Secondary | ICD-10-CM | POA: Diagnosis not present

## 2018-05-06 DIAGNOSIS — N186 End stage renal disease: Secondary | ICD-10-CM | POA: Diagnosis not present

## 2018-05-06 DIAGNOSIS — Z4682 Encounter for fitting and adjustment of non-vascular catheter: Secondary | ICD-10-CM | POA: Diagnosis not present

## 2018-05-06 DIAGNOSIS — R918 Other nonspecific abnormal finding of lung field: Secondary | ICD-10-CM | POA: Diagnosis not present

## 2018-05-13 DEATH — deceased

## 2018-05-19 ENCOUNTER — Ambulatory Visit: Payer: Medicare HMO | Admitting: Cardiology

## 2018-11-11 IMAGING — NM NM PULMONARY VENT & PERF
14 series · 14 of 14 positions shown · non-contrast
Comparison: Chest radiograph December 24, 2017

CLINICAL DATA: Shortness of breath

EXAM:
NUCLEAR MEDICINE VENTILATION - PERFUSION LUNG SCAN
VIEWS:
Anterior, posterior, left lateral, right lateral, RPO, LPO, RAO,
LAO-ventilation and perfusion
RADIOPHARMACEUTICALS:  32.5 mCi of 9c-BBm DTPA aerosol inhalation
and 4.3 mCi [id number] IV

[Series 1: ant/post vent · 4.14mm/px · 1 of 1 slices shown]
[im 1/1]
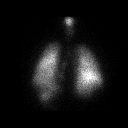

[Series 2: lao/rpo vent · 4.14mm/px · 1 of 1 slices shown (1 of 2)]
[im 1/1]
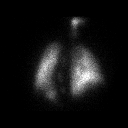

[Series 2: lao/rpo vent · 4.14mm/px · 1 of 1 slices shown (2 of 2)]
[im 1/1]
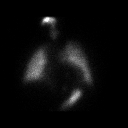

[Series 3: lpo/rao vent · 4.14mm/px · 1 of 1 slices shown]
[im 1/1]
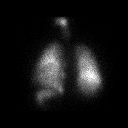

[Series 4: lt lat/rt lat vent · 4.14mm/px · 1 of 1 slices shown (1 of 2)]
[im 1/1]
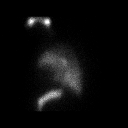

[Series 4: lt lat/rt lat vent · 4.14mm/px · 1 of 1 slices shown (2 of 2)]
[im 1/1]
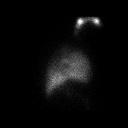

[Series 5: lt lat/rt lat perf · 4.14mm/px · 1 of 1 slices shown (1 of 2)]
[im 1/1]
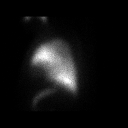

[Series 5: lt lat/rt lat perf · 4.14mm/px · 1 of 1 slices shown (2 of 2)]
[im 1/1]
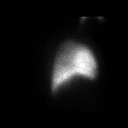

[Series 6: lpo/rao perf · 4.14mm/px · 1 of 1 slices shown (1 of 2)]
[im 1/1]
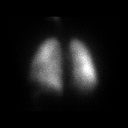

[Series 6: lpo/rao perf · 4.14mm/px · 1 of 1 slices shown (2 of 2)]
[im 1/1]
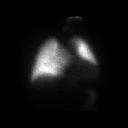

[Series 7: ant/post perf · 4.14mm/px · 1 of 1 slices shown (1 of 2)]
[im 1/1]
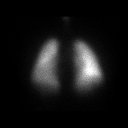

[Series 7: ant/post perf · 4.14mm/px · 1 of 1 slices shown (2 of 2)]
[im 1/1]
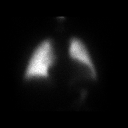

[Series 8: lao/rpo perf · 4.14mm/px · 1 of 1 slices shown (1 of 2)]
[im 1/1]
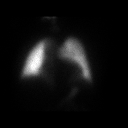

[Series 8: lao/rpo perf · 4.14mm/px · 1 of 1 slices shown (2 of 2)]
[im 1/1]
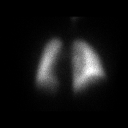

[14 of 14 positions shown; findings below may reference images not displayed]

FINDINGS: Ventilation: Radiotracer uptake is homogeneous and symmetric
bilaterally. No ventilation defects evident.

Perfusion: Radiotracer uptake is homogeneous and symmetric
bilaterally. No perfusion defects evident.

Cardiomegaly noted.
IMPRESSION: No appreciable ventilation or perfusion defects evident. This study
constitutes a very low probability of pulmonary embolus.
Cardiomegaly noted.
